# Patient Record
Sex: Male | Born: 1970 | Race: Black or African American | Hispanic: No | Smoking: Never smoker
Health system: Southern US, Community
[De-identification: ages and names within clinical notes are randomized; demographics above are authoritative.]

## PROBLEM LIST (undated history)

## (undated) DIAGNOSIS — Z9989 Dependence on other enabling machines and devices: Secondary | ICD-10-CM

## (undated) DIAGNOSIS — R079 Chest pain, unspecified: Secondary | ICD-10-CM

## (undated) DIAGNOSIS — G4733 Obstructive sleep apnea (adult) (pediatric): Secondary | ICD-10-CM

## (undated) DIAGNOSIS — R7303 Prediabetes: Secondary | ICD-10-CM

## (undated) DIAGNOSIS — K469 Unspecified abdominal hernia without obstruction or gangrene: Secondary | ICD-10-CM

## (undated) DIAGNOSIS — I10 Essential (primary) hypertension: Secondary | ICD-10-CM

## (undated) DIAGNOSIS — Z6841 Body Mass Index (BMI) 40.0 and over, adult: Secondary | ICD-10-CM

## (undated) HISTORY — PX: OTHER SURGICAL HISTORY: SHX169

## (undated) HISTORY — DX: Prediabetes: R73.03

## (undated) HISTORY — PX: KNEE SURGERY: SHX244

## (undated) HISTORY — DX: Chest pain, unspecified: R07.9

## (undated) HISTORY — DX: Obstructive sleep apnea (adult) (pediatric): G47.33

## (undated) HISTORY — PX: EYE SURGERY: SHX253

---

## 1998-08-09 ENCOUNTER — Emergency Department (HOSPITAL_COMMUNITY): Admission: EM | Admit: 1998-08-09 | Discharge: 1998-08-09 | Payer: Self-pay | Admitting: Emergency Medicine

## 1998-08-09 ENCOUNTER — Encounter: Payer: Self-pay | Admitting: Emergency Medicine

## 2002-05-30 DIAGNOSIS — G4733 Obstructive sleep apnea (adult) (pediatric): Secondary | ICD-10-CM

## 2002-05-30 HISTORY — DX: Obstructive sleep apnea (adult) (pediatric): G47.33

## 2002-09-16 ENCOUNTER — Emergency Department (HOSPITAL_COMMUNITY): Admission: EM | Admit: 2002-09-16 | Discharge: 2002-09-16 | Payer: Self-pay | Admitting: Emergency Medicine

## 2002-10-18 ENCOUNTER — Emergency Department (HOSPITAL_COMMUNITY): Admission: EM | Admit: 2002-10-18 | Discharge: 2002-10-19 | Payer: Self-pay | Admitting: Emergency Medicine

## 2003-08-05 ENCOUNTER — Emergency Department (HOSPITAL_COMMUNITY): Admission: EM | Admit: 2003-08-05 | Discharge: 2003-08-05 | Payer: Self-pay | Admitting: Family Medicine

## 2004-04-30 ENCOUNTER — Emergency Department (HOSPITAL_COMMUNITY): Admission: EM | Admit: 2004-04-30 | Discharge: 2004-04-30 | Payer: Self-pay | Admitting: Emergency Medicine

## 2006-10-13 ENCOUNTER — Emergency Department (HOSPITAL_COMMUNITY): Admission: EM | Admit: 2006-10-13 | Discharge: 2006-10-13 | Payer: Self-pay | Admitting: Emergency Medicine

## 2007-06-14 ENCOUNTER — Emergency Department (HOSPITAL_COMMUNITY): Admission: EM | Admit: 2007-06-14 | Discharge: 2007-06-14 | Payer: Self-pay | Admitting: Family Medicine

## 2008-03-22 ENCOUNTER — Emergency Department (HOSPITAL_COMMUNITY): Admission: EM | Admit: 2008-03-22 | Discharge: 2008-03-22 | Payer: Self-pay | Admitting: Emergency Medicine

## 2008-03-26 ENCOUNTER — Emergency Department (HOSPITAL_COMMUNITY): Admission: EM | Admit: 2008-03-26 | Discharge: 2008-03-26 | Payer: Self-pay | Admitting: Family Medicine

## 2008-10-13 ENCOUNTER — Ambulatory Visit (HOSPITAL_BASED_OUTPATIENT_CLINIC_OR_DEPARTMENT_OTHER): Admission: RE | Admit: 2008-10-13 | Discharge: 2008-10-13 | Payer: Self-pay | Admitting: Otolaryngology

## 2008-10-18 ENCOUNTER — Ambulatory Visit: Payer: Self-pay | Admitting: Internal Medicine

## 2009-01-26 ENCOUNTER — Emergency Department (HOSPITAL_COMMUNITY): Admission: EM | Admit: 2009-01-26 | Discharge: 2009-01-26 | Payer: Self-pay | Admitting: Family Medicine

## 2010-10-12 NOTE — Procedures (Signed)
NAME:  MCDONALD, REILING NO.:  [id number]   MEDICAL RECORD NO.:  [id number]          PATIENT TYPE:  OUT   LOCATION:  SLEEP CENTER                 FACILITY:  [facility]   PHYSICIAN:  Clinton D. Maple Hudson, MD, FCCP, FACPDATE OF BIRTH:  20-May-1972   DATE OF STUDY:  10/13/2008                            NOCTURNAL POLYSOMNOGRAM   REFERRING PHYSICIAN:   REFERRING PHYSICIAN:  Jefry H. Pollyann Kennedy, MD   INDICATION FOR STUDY:  Hypersomnia with sleep apnea.   EPWORTH SLEEPINESS SCORE:  Epworth sleepiness score 11/24, BMI 33.9,  weight 250 pounds, height 72 inches and neck 17.5 inches.   MEDICATIONS:  No home medication was listed.   SLEEP ARCHITECTURE:  Total sleep time 383 minutes with sleep efficiency  81%.  Stage I was 7%, stage II 74.4%, stage III absent, REM 18.5% of  total sleep time.  Sleep latency 80 minutes, REM latency 121 minutes,  awake after sleep onset 11 minutes, arousal index 35.6.  No bedtime  medication was taken.   RESPIRATORY DATA:  Diagnostic NPSG protocol ordered.  Apnea/hypopnea  index (AHI) 29.6 per hour.  A total of 189 events were scored including  144 obstructive apneas, two central apneas, two mixed apneas and 41  hypopneas.  Events were not positional.  REM AHI 59.2 per hour.  An  additional 66 respiratory effort related arousals was counted for an  overall respiratory disturbance index (RDI) of 39.9 per hour.  This was  ordered as a diagnostic NPSG protocol and CPAP titration was not done.   OXYGEN DATA:  Moderately loud snoring with oxygen desaturation to a  nadir of 68%.  Mean oxygen saturation through the study was 96.6% on  room air.  A total of 8.3 minutes was spent with oxygen saturation less  than 88% on room air.   CARDIAC DATA:  Normal sinus rhythm.   MOVEMENT-PARASOMNIA:  No significant movement disturbance.  No bathroom  trips.   IMPRESSIONS-RECOMMENDATIONS:  1. Moderate obstructive sleep apnea/hypopnea syndrome, AHI 29.6, RDI      39.9  per hour.  Events were not positional.  Moderately loud      snoring with oxygen desaturation to a nadir of 68%.  2. This was a diagnostic NPSG protocol as ordered.  Consider return      for CPAP titration or evaluate for alternative management as      clinically indicated.      Clinton D. Maple Hudson, MD, [facility], FACP  Diplomate, Biomedical engineer of Sleep Medicine  Electronically Signed     CDY/MEDQ  D:  10/18/2008 10:20:41  T:  10/19/2008 05:09:57  Job:  [id number]

## 2011-11-07 ENCOUNTER — Emergency Department (HOSPITAL_COMMUNITY)
Admission: EM | Admit: 2011-11-07 | Discharge: 2011-11-07 | Disposition: A | Discharge status: Home / Self Care | Payer: Self-pay | Attending: Emergency Medicine | Admitting: Emergency Medicine

## 2011-11-07 ENCOUNTER — Encounter (HOSPITAL_COMMUNITY): Payer: Self-pay | Admitting: *Deleted

## 2011-11-07 DIAGNOSIS — R109 Unspecified abdominal pain: Secondary | ICD-10-CM | POA: Insufficient documentation

## 2011-11-07 DIAGNOSIS — R112 Nausea with vomiting, unspecified: Secondary | ICD-10-CM

## 2011-11-07 DIAGNOSIS — R197 Diarrhea, unspecified: Secondary | ICD-10-CM

## 2011-11-07 LAB — URINALYSIS, ROUTINE W REFLEX MICROSCOPIC
Bilirubin Urine: NEGATIVE
Glucose, UA: NEGATIVE mg/dL
Hgb urine dipstick: NEGATIVE
Ketones, ur: NEGATIVE mg/dL
Leukocytes, UA: NEGATIVE
Nitrite: NEGATIVE
Protein, ur: NEGATIVE mg/dL
Specific Gravity, Urine: 1.03 (ref 1.005–1.030)
Urobilinogen, UA: 0.2 mg/dL (ref 0.0–1.0)
pH: 5.5 (ref 5.0–8.0)

## 2011-11-07 MED ORDER — LOPERAMIDE HCL 2 MG PO CAPS: 2.0000 mg | ORAL_CAPSULE | Freq: Four times a day (QID) | ORAL | Status: AC | PRN

## 2011-11-07 MED ORDER — LOPERAMIDE HCL 2 MG PO CAPS
2.0000 mg | ORAL_CAPSULE | Freq: Once | ORAL | Status: AC
  Administered 2011-11-07: 2 mg via ORAL
  Filled 2011-11-07: qty 1

## 2011-11-07 MED ORDER — ONDANSETRON HCL 4 MG PO TABS: 4.0000 mg | ORAL_TABLET | Freq: Four times a day (QID) | ORAL | Status: AC | PRN

## 2011-11-07 MED ORDER — ONDANSETRON 4 MG PO TBDP
4.0000 mg | ORAL_TABLET | Freq: Once | ORAL | Status: AC
  Administered 2011-11-07: 4 mg via ORAL
  Filled 2011-11-07: qty 1

## 2011-11-07 NOTE — Discharge Instructions (Signed)
RESOURCE GUIDE  Chronic Pain Problems: Contact [facility]  [phone] Patients need to be referred by their primary care doctor.  Insufficient Money for Medicine: Contact United Way:  call "211" or [facility] [phone].  No Primary Care Doctor: - Call [facility]  [phone] - can help you locate a primary care doctor that  accepts your insurance, provides certain services, etc. - Physician Referral Service- 1-800-533-3463  Agencies that provide inexpensive medical care: - [facility]  [phone] - [facility]  [phone] - [facility]  [phone] - [facility] Clinic  [phone] - Planned Parenthood  [phone] - [facility]  [phone]  Medicaid-accepting [place] Providers: - [facility]- [address]  [phone], Mon-Fri 9am-7pm, Sat 9am-1pm - [facility]- [address], [address]  [phone] - [facility]- [address], [address]  [phone] - [facility]- [address] [place] [address]  [phone] - Veita Bland- [address], [address], [phone]  Only accepts [place] Access Medicaid patients after they have their name  applied to their card  Self Pay (no insurance) in [place]: - Sickle Cell Patients: Dr Eric Dean, [facility]  [address], [phone] - [facility]- [address]  [phone]       -     [facility]- [address]       -     [facility]- see information above (Speak to Pam H if you do not have insurance)       -  [facility]- [address], [phone]       -  [facility] [place]- [address],  [phone]       -  [facility]- [address] [place] [address], [phone]       -  Dr Osei-Bonsu-  [address], [place], [phone]       -  [facility]- [address], [phone]       -  [facility]- [address] [place] [address], [phone], also [address], [phone]       -    [facility]- [address], [phone], 1st & 3rd Saturday   every month, 10am-1pm  1) Find a Doctor and Pay Out of Pocket Although you won't have to find out who is covered by your insurance plan, it is a good idea to ask around and get recommendations. You will then need to call the office and see if the doctor you have chosen will accept you as a new patient and what types of options they offer for patients who are self-pay. Some doctors offer discounts or will set up payment plans for their patients who do not have insurance, but you will need to ask so you aren't surprised when you get to your appointment.  2) Contact Your Local Health Department Not all health departments have doctors that can see patients for sick visits, but many do, so it is worth a call to see if yours does. If you don't know where your local health department is, you can check in your phone book. The CDC also has a tool to help you locate your state's health department, and many state websites also have   listings of all of their local health departments.  3) Find a Walk-in Clinic If your illness is not likely to be very severe or complicated, you may want to try a walk in clinic. These are popping up all over the country in pharmacies, drugstores, and shopping centers. They're usually staffed by nurse practitioners or physician assistants that have been trained to treat common illnesses and complaints. They're usually fairly quick and inexpensive. However, if you have serious medical issues or chronic medical problems, these are probably not your best option  STD Testing - [place] [facility] [place], [facility] Clinic, [address], [place], phone [phone] or 1-877-539-9860.  Monday - Friday, call for an appointment. - [place]  [facility] [place], [facility]D Clinic, [address], [place], phone [phone] or 1-877-539-9860.  Monday - Friday, call for an appointment.  Abuse/Neglect: - [place] Child Abuse Hotline (336) 641-3795 - [place] Child Abuse Hotline 800-378-5315 (After Hours)  Emergency Shelter:  [facility] (336) 271-5985  Maternity Homes: - [facility] (336) 275-9566 - [facility] (704) 372-4663  MRSA Hotline #:   832-7006  [place] Resources  [facility] [place]  [employer] [place] [facility] [address]                 [address]         [address]  [place]                                               [place]                              [place] Phone:  [phone]                                  Phone:  [phone]                   Phone:  [phone]  [place] [facility], [phone] - [place] Services - [facility]- 1-888-581-9988       -     [facility] in , [address],                                  336-349-4454, Insurance  [place] Child Abuse Hotline (336) 342-1394 or (336) 342-3537 (After Hours)   Behavioral Health Services  Substance Abuse Resources: - [facility]  336-882-2125 - [facility] 336-784-9470 - [facility] 336-285-9073 - [facility] 336-845-3988 - Residential & Outpatient Substance Abuse Program  800-659-3381  Psychological Services: - [facility]  [phone] - [facility]  [phone] - [place] [facility], [address], [place], ACCESS LINE: 1-800-853-5163 or 336-641-4981, Http://www.[facility].com/services/adult.htm  Dental Assistance  If unable to pay or uninsured, contact:  [facility] or [place] [facility] to become qualified for the adult dental  clinic.  Patients with Medicaid:  [facility] [address], [phone], [phone]  If unable   to pay, or uninsured, contact [facility] ([phone]) or [place] [facility] ([phone] in [place], [phone] in [place]) to become qualified for the adult dental clinic  Other Low-Cost Community Dental Services: - [facility]- [address], [place], [place], [address], [phone], Ext. [address], 2nd and 4th Thursday of the month at 6:30am.  10 clients each day by appointment, can sometimes see walk-in patients if someone does not show for an appointment. - [facility]- [address], [place], [place], [address], [phone] - [facility]- [address], [place], [place], [address], [phone] - [place] [facility]- [phone] - [facility]- [phone] - [facility]- [phone]    

## 2011-11-07 NOTE — ED Notes (Signed)
Pt states "starting having abdominal pain with vomiting & diarrhea since Saturday, think it's a virus"

## 2011-11-07 NOTE — ED Notes (Signed)
md at bedside  Pt alert and oriented x4. Respirations even and unlabored, bilateral symmetrical rise and fall of chest. Skin warm and dry. In no acute distress. Denies needs.   

## 2011-11-07 NOTE — ED Provider Notes (Signed)
History    41 year old male with abdominal pain, vomiting and diarrhea. Gradual onset of symptoms. On since Saturday. Persistent since. Abdominal pain is diffuse and crampy. No appreciable exacerbating. Several episodes of nonbilious, nonbloody emesis. No blood in the stool. No emesis. No sick contacts. No fevers or chills. Denies history of abdominal surgery. No urinary complaints. or relieving factors.  CSN: [id number]  Arrival date & time 11/07/11  1419   First MD Initiated Contact with Patient 11/07/11 1550      Chief Complaint  Patient presents with  . Abdominal Pain    (Consider location/radiation/quality/duration/timing/severity/associated sxs/prior treatment) HPI  History reviewed. No pertinent past medical history.  Past Surgical History  Procedure Date  . Knee surgery     left    No family history on file.  History  Substance Use Topics  . Smoking status: Never Smoker   . Smokeless tobacco: Not on file  . Alcohol Use: Yes     ocassionally      Review of Systems   Review of symptoms negative unless otherwise noted in HPI.   Allergies  Review of patient's allergies indicates no known allergies.  Home Medications   Current Outpatient Rx  Name Route Sig Dispense Refill  . NAPROXEN SODIUM 220 MG PO TABS Oral Take 220 mg by mouth 2 (two) times daily with a meal.    . LOPERAMIDE HCL 2 MG PO CAPS Oral Take 1 capsule (2 mg total) by mouth 4 (four) times daily as needed for diarrhea or loose stools. 12 capsule 0  . ONDANSETRON HCL 4 MG PO TABS Oral Take 1 tablet (4 mg total) by mouth every 6 (six) hours as needed for nausea. 12 tablet 0    BP 139/91  Pulse 87  Temp(Src) 98.8 F (37.1 C) (Oral)  Resp 17  Wt 267 lb 3.2 oz (121.201 kg)  SpO2 99%  Physical Exam  Nursing note and vitals reviewed. Constitutional: He appears well-developed and well-nourished. No distress.  HENT:  Head: Normocephalic and atraumatic.  Eyes: Conjunctivae are normal. Right eye  exhibits no discharge. Left eye exhibits no discharge.  Neck: Neck supple.  Cardiovascular: Normal rate, regular rhythm and normal heart sounds.  Exam reveals no gallop and no friction rub.   No murmur heard. Pulmonary/Chest: Effort normal and breath sounds normal. No respiratory distress.  Abdominal: Soft. He exhibits no distension and no mass. There is no tenderness.  Genitourinary:       No CVA tenderness  Musculoskeletal: He exhibits no edema and no tenderness.  Neurological: He is alert.  Skin: Skin is warm and dry.  Psychiatric: He has a normal mood and affect. His behavior is normal. Thought content normal.    ED Course  Procedures (including critical care time)   Labs Reviewed  URINALYSIS, ROUTINE W REFLEX MICROSCOPIC  LAB REPORT - SCANNED   No results found.   1. Nausea & vomiting   2. Diarrhea       MDM  41 year old male with nausea, vomiting and diarrhea. Suspect viral illness. Abdominal exam is benign. Patient is afebrile and HD stable. Do not feel that imaging or laboratory testing is needed at this time as I have a low suspicion for emergent etiology. Plan symptomatic treatment. Return precautions were discussed. Work note was provided. Patient works as a Financial risk analyst. Discussed with patient that he should not return to work until symptoms have resolved.        Raeford Razor, MD 11/10/11 [phone]

## 2011-11-07 NOTE — Progress Notes (Signed)
Pt seen by [facility] community liaison and states he has insurance coverage but left insurance card at home.  Refused [facility] information

## 2012-01-06 ENCOUNTER — Emergency Department (HOSPITAL_COMMUNITY)
Admission: EM | Admit: 2012-01-06 | Discharge: 2012-01-06 | Disposition: A | Discharge status: Home / Self Care | Payer: Self-pay | Attending: Emergency Medicine | Admitting: Emergency Medicine

## 2012-01-06 ENCOUNTER — Encounter (HOSPITAL_COMMUNITY): Payer: Self-pay | Admitting: *Deleted

## 2012-01-06 DIAGNOSIS — H109 Unspecified conjunctivitis: Secondary | ICD-10-CM | POA: Insufficient documentation

## 2012-01-06 MED ORDER — POLYMYXIN B-TRIMETHOPRIM 10000-0.1 UNIT/ML-% OP SOLN
1.0000 [drp] | OPHTHALMIC | Status: DC
  Administered 2012-01-06: 1 [drp] via OPHTHALMIC
  Filled 2012-01-06: qty 10

## 2012-01-06 MED ORDER — POLYMYXIN B-TRIMETHOPRIM 10000-0.1 UNIT/ML-% OP SOLN: 1.0000 [drp] | OPHTHALMIC | Status: AC

## 2012-01-06 NOTE — ED Notes (Signed)
Pt awaiting eye drops from pharmacy

## 2012-01-06 NOTE — ED Provider Notes (Signed)
History     CSN: [id number]  Arrival date & time 01/06/12  1434   First MD Initiated Contact with Patient 01/06/12 1514      Chief Complaint  Patient presents with  . Eye Pain    (Consider location/radiation/quality/duration/timing/severity/associated sxs/prior treatment) Patient is a 41 y.o. male presenting with conjunctivitis. The history is provided by the patient.  Conjunctivitis  The current episode started 3 to 5 days ago. The onset was gradual. The problem occurs continuously. The problem has been unchanged. The problem is mild. Nothing relieves the symptoms. Nothing aggravates the symptoms. Associated symptoms include eye itching and eye redness. Pertinent negatives include no fever, no decreased vision, no double vision, no photophobia, no headaches, no swollen glands, no URI, no eye discharge and no eye pain. The eye pain is mild. There is pain in the right eye. The eye pain is not associated with movement. The eyelid exhibits redness. There were no sick contacts.    History reviewed. No pertinent past medical history.  Past Surgical History  Procedure Date  . Knee surgery     left    History reviewed. No pertinent family history.  History  Substance Use Topics  . Smoking status: Never Smoker   . Smokeless tobacco: Not on file  . Alcohol Use: Yes     ocassionally      Review of Systems  Constitutional: Negative for fever.  Eyes: Positive for redness and itching. Negative for double vision, photophobia, pain and discharge.  Neurological: Negative for headaches.    Allergies  Review of patient's allergies indicates no known allergies.  Home Medications  No current outpatient prescriptions on file.  BP 135/89  Pulse 91  Temp 98.5 F (36.9 C) (Oral)  Resp 19  SpO2 98%  Physical Exam  Nursing note and vitals reviewed. Constitutional: He appears well-developed and well-nourished. No distress.  HENT:  Head: Normocephalic and atraumatic.  Right Ear:  External ear normal.  Left Ear: External ear normal.  Eyes: EOM and lids are normal. Pupils are equal, round, and reactive to light. Right eye exhibits chemosis. Right eye exhibits no discharge and no exudate. No foreign body present in the right eye. Left eye exhibits no discharge. Right conjunctiva is injected. Right conjunctiva has no hemorrhage. No scleral icterus.  Neck: Neck supple. No tracheal deviation present.  Cardiovascular: Normal rate.   Pulmonary/Chest: Effort normal. No stridor. No respiratory distress.  Musculoskeletal: He exhibits no edema.  Neurological: He is alert. Cranial nerve deficit: no gross deficits.  Skin: Skin is warm and dry. No rash noted.  Psychiatric: He has a normal mood and affect.    ED Course  Procedures (including critical care time)  Labs Reviewed - No data to display No results found.   1. Conjunctivitis of right eye       MDM  Patient without signs of visual disturbance or pain. The symptoms are primarily itching. This is consistent with an infectious conjunctivitis. Most likely viral but will prescribe antibiotics in case of possible bacterial cause.        Celene Kras, MD 01/06/12 [phone]

## 2012-01-06 NOTE — ED Notes (Signed)
Pt c/o right eye pain and redness x 4 days; pt denies obvious injury 

## 2012-01-07 ENCOUNTER — Encounter (HOSPITAL_COMMUNITY): Payer: Self-pay | Admitting: *Deleted

## 2012-01-07 ENCOUNTER — Emergency Department (HOSPITAL_COMMUNITY)
Admission: EM | Admit: 2012-01-07 | Discharge: 2012-01-07 | Disposition: A | Discharge status: Home / Self Care | Payer: Self-pay | Attending: Emergency Medicine | Admitting: Emergency Medicine

## 2012-01-07 DIAGNOSIS — H10029 Other mucopurulent conjunctivitis, unspecified eye: Secondary | ICD-10-CM | POA: Insufficient documentation

## 2012-01-07 DIAGNOSIS — H109 Unspecified conjunctivitis: Secondary | ICD-10-CM

## 2012-01-07 NOTE — ED Provider Notes (Signed)
History    This chart was scribed for Gerhard Munch, MD, MD by Smitty Pluck. The patient was seen in room TR02C and the patient's care was started at 2:32PM.   CSN: [id number]  Arrival date & time 01/07/12  1349   None     Chief Complaint  Patient presents with  . Eye Problem    (Consider location/radiation/quality/duration/timing/severity/associated sxs/prior treatment) Patient is a 41 y.o. male presenting with eye problem. The history is provided by the patient.  Eye Problem  Associated symptoms include discharge and eye redness. Pertinent negatives include no vomiting.   Christopher Schultz is a 41 y.o. male who presents to the Emergency Department complaining of moderate right eye irritation onset 4 days ago. Pt was in ED 1 day ago and diagnosed with pink eye. Pt reports that he has used eye drops without relief. Denies visual changes. Pt reports that eye irritation started on right and the left eye is starting to feel irritated.   History reviewed. No pertinent past medical history.  Past Surgical History  Procedure Date  . Knee surgery     left    No family history on file.  History  Substance Use Topics  . Smoking status: Never Smoker   . Smokeless tobacco: Not on file  . Alcohol Use: Yes     ocassionally      Review of Systems  Constitutional:       Per HPI, otherwise negative  HENT:       Per HPI, otherwise negative  Eyes: Positive for pain, discharge, redness and itching. Negative for visual disturbance.  Respiratory:       Per HPI, otherwise negative  Cardiovascular:       Per HPI, otherwise negative  Gastrointestinal: Negative for vomiting.  Genitourinary: Negative.   Musculoskeletal:       Per HPI, otherwise negative  Skin: Negative.   Neurological: Negative for syncope.    Allergies  Review of patient's allergies indicates no known allergies.  Home Medications   Current Outpatient Rx  Name Route Sig Dispense Refill  . POLYMYXIN  B-TRIMETHOPRIM 10000-0.1 UNIT/ML-% OP SOLN Right Eye Place 1 drop into the right eye every 4 (four) hours. 10 mL 0    BP 142/94  Pulse 84  Temp 98 F (36.7 C) (Oral)  Resp 16  SpO2 99%  Physical Exam  Nursing note and vitals reviewed. Constitutional: He is oriented to person, place, and time. He appears well-developed and well-nourished.  HENT:  Head: Normocephalic and atraumatic.  Eyes: Right eye exhibits discharge. Left eye exhibits no discharge. Right conjunctiva is injected.       Right eye has area of erythema     Pulmonary/Chest: Effort normal. No respiratory distress.  Neurological: He is alert and oriented to person, place, and time.  Skin: Skin is warm and dry.  Psychiatric: He has a normal mood and affect. His behavior is normal.    ED Course  Procedures (including critical care time)   COORDINATION OF CARE: 2:38PM EDP discusses pt ED treatment with pt     Labs Reviewed - No data to display No results found.   No diagnosis found.    MDM  The patient presents one day after initially being diagnosed with pink eye.  Notably, the patient denies any new acuity changes, uterus, chills, significant headache, nausea, vomiting, or other overt signs of systemic infection.  The patient's chief complaint seems to be irritation secondary to irritant exposure at work.  Given this, the patient was counseled on return precautions, discharged with a extended absence from work note, and ophthalmology followup in  Gerhard Munch, MD 01/07/12 1443

## 2012-01-07 NOTE — ED Notes (Signed)
Pt diagnosed with pink eye yesterday on right eye.  Same symptoms and no vision change

## 2012-04-22 ENCOUNTER — Encounter (HOSPITAL_COMMUNITY): Payer: Self-pay | Admitting: *Deleted

## 2012-04-22 ENCOUNTER — Emergency Department (HOSPITAL_COMMUNITY)
Admission: EM | Admit: 2012-04-22 | Discharge: 2012-04-22 | Disposition: A | Discharge status: Home / Self Care | Payer: Self-pay | Attending: Emergency Medicine | Admitting: Emergency Medicine

## 2012-04-22 ENCOUNTER — Emergency Department (HOSPITAL_COMMUNITY): Payer: Self-pay

## 2012-04-22 DIAGNOSIS — X58XXXA Exposure to other specified factors, initial encounter: Secondary | ICD-10-CM | POA: Insufficient documentation

## 2012-04-22 DIAGNOSIS — S93409A Sprain of unspecified ligament of unspecified ankle, initial encounter: Secondary | ICD-10-CM | POA: Insufficient documentation

## 2012-04-22 DIAGNOSIS — S93401A Sprain of unspecified ligament of right ankle, initial encounter: Secondary | ICD-10-CM

## 2012-04-22 DIAGNOSIS — Y9389 Activity, other specified: Secondary | ICD-10-CM | POA: Insufficient documentation

## 2012-04-22 DIAGNOSIS — Y9289 Other specified places as the place of occurrence of the external cause: Secondary | ICD-10-CM | POA: Insufficient documentation

## 2012-04-22 MED ORDER — HYDROCODONE-ACETAMINOPHEN 5-325 MG PO TABS: 1.0000 | ORAL_TABLET | ORAL | Status: DC | PRN

## 2012-04-22 MED ORDER — IBUPROFEN 800 MG PO TABS: 800.0000 mg | ORAL_TABLET | Freq: Three times a day (TID) | ORAL | Status: DC

## 2012-04-22 NOTE — ED Provider Notes (Signed)
Medical screening examination/treatment/procedure(s) were performed by non-physician practitioner and as supervising physician I was immediately available for consultation/collaboration.    Nelia Shi, MD 04/22/12 [phone]

## 2012-04-22 NOTE — ED Notes (Signed)
Reports stepping into a hole yesterday, having pain and swelling to right ankle.

## 2012-04-22 NOTE — Progress Notes (Signed)
Orthopedic Tech Progress Note Patient Details:  SAMIEL HEDQUIST [birth year] [id number] Ankle ASO applied to Right LE with instruction; crutches fitted for patient height and comfort. Patient stated he had used crutches in the past and felt safe to use. Ortho Devices Type of Ortho Device: Crutches;ASO Ortho Device/Splint Location: Right LE Ortho Device/Splint Interventions: Application   Asia R Thompson 04/22/2012, 11:59 AM

## 2012-04-22 NOTE — ED Provider Notes (Signed)
History     CSN: [id number]  Arrival date & time 04/22/12  6295   First MD Initiated Contact with Patient 04/22/12 1000      Chief Complaint  Patient presents with  . Ankle Pain    (Consider location/radiation/quality/duration/timing/severity/associated sxs/prior treatment) Patient is a 41 y.o. male presenting with ankle pain. The history is provided by the patient.  Ankle Pain  The incident occurred yesterday. The incident occurred in the yard. Associated symptoms comments: He complains of right ankle pain and swelling after stepping in a hole while working in the yard yesterday. No other injury.Marland Kitchen    History reviewed. No pertinent past medical history.  Past Surgical History  Procedure Date  . Knee surgery     left    History reviewed. No pertinent family history.  History  Substance Use Topics  . Smoking status: Never Smoker   . Smokeless tobacco: Not on file  . Alcohol Use: Yes     Comment: ocassionally      Review of Systems  Constitutional: Negative for fever and chills.  HENT: Negative.   Respiratory: Negative.   Cardiovascular: Negative.   Gastrointestinal: Negative.   Musculoskeletal: Negative.        See HPI  Skin: Negative.   Neurological: Negative.     Allergies  Review of patient's allergies indicates no known allergies.  Home Medications   Current Outpatient Rx  Name  Route  Sig  Dispense  Refill  . IBUPROFEN 200 MG PO TABS   Oral   Take 400 mg by mouth every 6 (six) hours as needed. For pain           BP 141/90  Pulse 92  Temp 98 F (36.7 C) (Oral)  Resp 18  SpO2 97%  Physical Exam  Constitutional: He is oriented to person, place, and time. He appears well-developed and well-nourished.  Neck: Normal range of motion.  Pulmonary/Chest: Effort normal.  Musculoskeletal: Normal range of motion.       Right ankle moderately swollen laterally without bony deformity. Joint stable. Tender bilaterally, most over lateral aspect.    Neurological: He is alert and oriented to person, place, and time.  Skin: Skin is warm and dry.  Psychiatric: He has a normal mood and affect.    ED Course  Procedures (including critical care time)  Labs Reviewed - No data to display Dg Ankle Complete Right  04/22/2012  *RADIOLOGY REPORT*  Clinical Data: 41 year old male with pain after injury.  RIGHT ANKLE - COMPLETE 3+ VIEW  Comparison: None.  Findings: Mortise joint effusion cannot be excluded.  Mortise joint alignment is preserved and the talar dome is intact.  However, there is a small avulsion fragment distal to the medial malleolus. Soft tissue swelling appears greater about the lateral malleolus, but no lateral fractures identified.  The calcaneus and posterior malleolus appear intact.  IMPRESSION: Soft tissue swelling and possible joint effusion.  Small acute avulsion fragment adjacent to the medial malleolus.  No other fracture or dislocation.   Original Report Authenticated By: Erskine Speed, M.D.      No diagnosis found. 1. Right ankle sprain   MDM  Uncomplicated ankle sprain.        Rodena Medin, PA-C 04/22/12 1111

## 2012-10-05 ENCOUNTER — Ambulatory Visit (HOSPITAL_COMMUNITY): Payer: Self-pay

## 2012-10-05 ENCOUNTER — Encounter (HOSPITAL_COMMUNITY): Payer: Self-pay

## 2012-10-05 ENCOUNTER — Emergency Department (HOSPITAL_COMMUNITY)
Admission: EM | Admit: 2012-10-05 | Discharge: 2012-10-05 | Disposition: A | Discharge status: Home / Self Care | Payer: Self-pay | Attending: Emergency Medicine | Admitting: Emergency Medicine

## 2012-10-05 DIAGNOSIS — K429 Umbilical hernia without obstruction or gangrene: Secondary | ICD-10-CM | POA: Insufficient documentation

## 2012-10-05 DIAGNOSIS — M549 Dorsalgia, unspecified: Secondary | ICD-10-CM | POA: Insufficient documentation

## 2012-10-05 DIAGNOSIS — R1905 Periumbilic swelling, mass or lump: Secondary | ICD-10-CM | POA: Insufficient documentation

## 2012-10-05 DIAGNOSIS — K420 Umbilical hernia with obstruction, without gangrene: Secondary | ICD-10-CM

## 2012-10-05 LAB — URINALYSIS, ROUTINE W REFLEX MICROSCOPIC
Bilirubin Urine: NEGATIVE
Glucose, UA: NEGATIVE mg/dL
Hgb urine dipstick: NEGATIVE
Ketones, ur: NEGATIVE mg/dL
Protein, ur: NEGATIVE mg/dL
Urobilinogen, UA: 0.2 mg/dL (ref 0.0–1.0)

## 2012-10-05 LAB — CBC WITH DIFFERENTIAL/PLATELET
Basophils Absolute: 0 10*3/uL (ref 0.0–0.1)
Eosinophils Absolute: 0.1 10*3/uL (ref 0.0–0.7)
Eosinophils Relative: 1 % (ref 0–5)
HCT: 45.7 % (ref 39.0–52.0)
Lymphocytes Relative: 28 % (ref 12–46)
MCH: 30 pg (ref 26.0–34.0)
MCV: 88.6 fL (ref 78.0–100.0)
Monocytes Absolute: 0.4 10*3/uL (ref 0.1–1.0)
RDW: 13.3 % (ref 11.5–15.5)
WBC: 4.4 10*3/uL (ref 4.0–10.5)

## 2012-10-05 LAB — BASIC METABOLIC PANEL
CO2: 28 mEq/L (ref 19–32)
Calcium: 9.7 mg/dL (ref 8.4–10.5)
Creatinine, Ser: 1.14 mg/dL (ref 0.50–1.35)
GFR calc non Af Amer: 79 mL/min — ABNORMAL LOW (ref >90)
Glucose, Bld: 95 mg/dL (ref 70–99)

## 2012-10-05 MED ORDER — IOHEXOL 300 MG/ML  SOLN
100.0000 mL | Freq: Once | INTRAMUSCULAR | Status: AC | PRN
  Administered 2012-10-05: 100 mL via INTRAVENOUS

## 2012-10-05 MED ORDER — HYDROCODONE-ACETAMINOPHEN 5-325 MG PO TABS: 1.0000 | ORAL_TABLET | Freq: Four times a day (QID) | ORAL | Status: DC | PRN

## 2012-10-05 MED ORDER — IOHEXOL 300 MG/ML  SOLN
50.0000 mL | Freq: Once | INTRAMUSCULAR | Status: AC | PRN
  Administered 2012-10-05: 50 mL via ORAL

## 2012-10-05 MED ORDER — SODIUM CHLORIDE 0.9 % IV SOLN
INTRAVENOUS | Status: DC
  Administered 2012-10-05: 14:00:00 via INTRAVENOUS

## 2012-10-05 NOTE — ED Provider Notes (Signed)
History     CSN: [id number]  Arrival date & time 10/05/12  1104   First MD Initiated Contact with Patient 10/05/12 1246      Chief Complaint  Patient presents with  . Abdominal Pain  . Back Pain    (Consider location/radiation/quality/duration/timing/severity/associated sxs/prior treatment) HPI Comments: Christopher Schultz is a 42 y.o. Male who is here for evaluation of umbilical swelling. The swelling has been present for several months and worsens when he stands. It is a sometimes dull and sometimes sharp pain. Yesterday. The pain was so bad, he could not continue working. He has never been evaluated for it. He denies fever, chills, nausea, vomiting, change in bowel or urinary habits. He has not tried a medication for the problem. There are no other modifying factors.  Patient is a 42 y.o. male presenting with abdominal pain and back pain. The history is provided by the patient.  Abdominal Pain Back Pain Associated symptoms: abdominal pain     History reviewed. No pertinent past medical history.  Past Surgical History  Procedure Laterality Date  . Knee surgery      left    No family history on file.  History  Substance Use Topics  . Smoking status: Never Smoker   . Smokeless tobacco: Not on file  . Alcohol Use: Yes     Comment: ocassionally      Review of Systems  Gastrointestinal: Positive for abdominal pain.  Musculoskeletal: Positive for back pain.  All other systems reviewed and are negative.    Allergies  Review of patient's allergies indicates no known allergies.  Home Medications   Current Outpatient Rx  Name  Route  Sig  Dispense  Refill  . ibuprofen (ADVIL,MOTRIN) 200 MG tablet   Oral   Take 400 mg by mouth every 6 (six) hours as needed. For pain         . HYDROcodone-acetaminophen (NORCO/VICODIN) 5-325 MG per tablet   Oral   Take 1 tablet by mouth every 6 (six) hours as needed for pain.   12 tablet   0   . ibuprofen (ADVIL,MOTRIN) 800 MG  tablet   Oral   Take 1 tablet (800 mg total) by mouth 3 (three) times daily.   21 tablet   0     BP 123/86  Pulse 72  Temp(Src) 98.5 F (36.9 C) (Oral)  Resp 16  Ht 6' (1.829 m)  Wt 277 lb 8 oz (125.873 kg)  BMI 37.63 kg/m2  SpO2 98%  Physical Exam  Nursing note and vitals reviewed. Constitutional: He is oriented to person, place, and time. He appears well-developed and well-nourished.  HENT:  Head: Normocephalic and atraumatic.  Right Ear: External ear normal.  Left Ear: External ear normal.  Eyes: Conjunctivae and EOM are normal. Pupils are equal, round, and reactive to light.  Neck: Normal range of motion and phonation normal. Neck supple.  Cardiovascular: Normal rate, regular rhythm, normal heart sounds and intact distal pulses.   Pulmonary/Chest: Effort normal and breath sounds normal. He exhibits no bony tenderness.  Abdominal: Soft. Normal appearance. There is no tenderness.  Peri-umbilical mass (3 cm), consistent with hernia. It is partially reducible. It gets somewhat better with standing. The skin over the tissue does not appear to be erythematous or discolored.  Musculoskeletal: Normal range of motion.  Neurological: He is alert and oriented to person, place, and time. He has normal strength. No cranial nerve deficit or sensory deficit. He exhibits normal muscle tone. Coordination  normal.  Skin: Skin is warm, dry and intact.  Psychiatric: He has a normal mood and affect. His behavior is normal. Judgment and thought content normal.    ED Course  Procedures (including critical care time)  Labs Reviewed  BASIC METABOLIC PANEL - Abnormal; Notable for the following:    GFR calc non Af Amer 79 (*)    All other components within normal limits  URINALYSIS, ROUTINE W REFLEX MICROSCOPIC  CBC WITH DIFFERENTIAL   No results found.   1. Irreducible umbilical hernia       MDM  Symptomatic umbilical hernia. CT ordered to rule out incarceration and intestinal  involvement.   Care to oncoming provided to evaluate after CT returns        Flint Melter, MD 10/08/12 [phone]

## 2012-10-05 NOTE — Progress Notes (Signed)
[facility] spoke with patient. Patient stated he was waiting for insurance to go through. Gave him [facility] in case he needed a follow-up before his insurance was available.

## 2012-10-05 NOTE — ED Provider Notes (Signed)
4:08 PM I discussed the CT results with the patient. We also discussed return precautions, and home care instructions, including techniques to reduce hernia. He was discharged in stable condition to follow up with surgery.  Gerhard Munch, MD 10/05/12 [phone]

## 2012-10-05 NOTE — ED Notes (Signed)
Pt presents with abdominal pain that started about a week ago. Pt also says that he feels a "knot" around his navel that has been there for a few years. No n/v/d. Also complaining of lower back pain that started three days ago. No injury.

## 2013-04-10 ENCOUNTER — Emergency Department (HOSPITAL_COMMUNITY): Payer: 59

## 2013-04-10 ENCOUNTER — Encounter (HOSPITAL_COMMUNITY): Payer: Self-pay | Admitting: Emergency Medicine

## 2013-04-10 ENCOUNTER — Emergency Department (HOSPITAL_COMMUNITY)
Admission: EM | Admit: 2013-04-10 | Discharge: 2013-04-10 | Disposition: A | Discharge status: Home / Self Care | Payer: 59 | Attending: Emergency Medicine | Admitting: Emergency Medicine

## 2013-04-10 DIAGNOSIS — K429 Umbilical hernia without obstruction or gangrene: Secondary | ICD-10-CM

## 2013-04-10 DIAGNOSIS — R109 Unspecified abdominal pain: Secondary | ICD-10-CM | POA: Insufficient documentation

## 2013-04-10 DIAGNOSIS — E663 Overweight: Secondary | ICD-10-CM | POA: Insufficient documentation

## 2013-04-10 LAB — COMPREHENSIVE METABOLIC PANEL
ALT: 36 U/L (ref 0–53)
AST: 23 U/L (ref 0–37)
Albumin: 3.9 g/dL (ref 3.5–5.2)
Calcium: 9.3 mg/dL (ref 8.4–10.5)
GFR calc Af Amer: >90 mL/min (ref >90)
Sodium: 140 mEq/L (ref 135–145)
Total Protein: 7.4 g/dL (ref 6.0–8.3)

## 2013-04-10 LAB — CBC WITH DIFFERENTIAL/PLATELET
Basophils Absolute: 0 10*3/uL (ref 0.0–0.1)
Basophils Relative: 1 % (ref 0–1)
Eosinophils Absolute: 0.1 10*3/uL (ref 0.0–0.7)
Eosinophils Relative: 2 % (ref 0–5)
MCH: 30.6 pg (ref 26.0–34.0)
MCV: 90.1 fL (ref 78.0–100.0)
Neutrophils Relative %: 63 % (ref 43–77)
Platelets: 275 10*3/uL (ref 150–400)
RDW: 13.2 % (ref 11.5–15.5)
WBC: 5 10*3/uL (ref 4.0–10.5)

## 2013-04-10 MED ORDER — HYDROCODONE-ACETAMINOPHEN 5-325 MG PO TABS: 1.0000 | ORAL_TABLET | Freq: Four times a day (QID) | ORAL | Status: DC | PRN

## 2013-04-10 MED ORDER — MORPHINE SULFATE 4 MG/ML IJ SOLN
4.0000 mg | Freq: Once | INTRAMUSCULAR | Status: AC
  Administered 2013-04-10: 4 mg via INTRAVENOUS
  Filled 2013-04-10: qty 1

## 2013-04-10 MED ORDER — IBUPROFEN 800 MG PO TABS: 800.0000 mg | ORAL_TABLET | Freq: Three times a day (TID) | ORAL | Status: DC

## 2013-04-10 MED ORDER — HYDROCODONE-ACETAMINOPHEN 5-325 MG PO TABS
1.0000 | ORAL_TABLET | Freq: Once | ORAL | Status: AC
  Administered 2013-04-10: 1 via ORAL
  Filled 2013-04-10: qty 1

## 2013-04-10 MED ORDER — ONDANSETRON HCL 4 MG/2ML IJ SOLN
4.0000 mg | Freq: Once | INTRAMUSCULAR | Status: AC
  Administered 2013-04-10: 4 mg via INTRAVENOUS
  Filled 2013-04-10: qty 2

## 2013-04-10 NOTE — ED Provider Notes (Signed)
CSN: [id number]     Arrival date & time 04/10/13  7829 History   First MD Initiated Contact with Patient 04/10/13 1014     Chief Complaint  Patient presents with  . Abdominal Pain   (Consider location/radiation/quality/duration/timing/severity/associated sxs/prior Treatment) HPI  THis is a 42 yo male with umbilical hernia who presents with abdominal pain.  Patient states he was diagnosed 5 months ago with an umbilical hernia.  Patient states that over the last 2-3 days he has had increasing "pressure" pain over the hernia.  It is reducible but "comes out frequently."  Denies n/v.  Last normal BM was this morning.  Rates pain at 7/10.  Denies fevers.  History reviewed. No pertinent past medical history. Past Surgical History  Procedure Laterality Date  . Knee surgery      left   History reviewed. No pertinent family history. History  Substance Use Topics  . Smoking status: Never Smoker   . Smokeless tobacco: Not on file  . Alcohol Use: Yes     Comment: ocassionally    Review of Systems  Constitutional: Negative.  Negative for fever.  Respiratory: Negative.  Negative for chest tightness and shortness of breath.   Cardiovascular: Negative.  Negative for chest pain.  Gastrointestinal: Positive for abdominal pain. Negative for vomiting, diarrhea and constipation.  Genitourinary: Negative.   All other systems reviewed and are negative.    Allergies  Review of patient's allergies indicates no known allergies.  Home Medications   Current Outpatient Rx  Name  Route  Sig  Dispense  Refill  . HYDROcodone-acetaminophen (NORCO/VICODIN) 5-325 MG per tablet   Oral   Take 1 tablet by mouth every 6 (six) hours as needed.   10 tablet   0   . ibuprofen (ADVIL,MOTRIN) 800 MG tablet   Oral   Take 1 tablet (800 mg total) by mouth 3 (three) times daily.   21 tablet   0    BP 143/94  Pulse 105  Temp(Src) 98.4 F (36.9 C) (Oral)  Resp 16  SpO2 100% Physical Exam  Nursing note  and vitals reviewed. Constitutional: He is oriented to person, place, and time. He appears well-developed and well-nourished. No distress.  overweight  HENT:  Head: Normocephalic and atraumatic.  Cardiovascular: Normal rate, regular rhythm and normal heart sounds.   No murmur heard. Pulmonary/Chest: Effort normal and breath sounds normal. No respiratory distress.  Abdominal: Soft. Bowel sounds are normal. He exhibits no distension. There is no tenderness. There is no rebound.  Reproducible umbilical hernia, no redness or ttp over the hernia  Musculoskeletal: He exhibits no edema.  Lymphadenopathy:    He has no cervical adenopathy.  Neurological: He is alert and oriented to person, place, and time.  Skin: Skin is warm and dry.  Psychiatric: He has a normal mood and affect.    ED Course  Procedures (including critical care time) Labs Review Labs Reviewed  COMPREHENSIVE METABOLIC PANEL - Abnormal; Notable for the following:    Total Bilirubin 0.1 (*)    GFR calc non Af Amer 89 (*)    All other components within normal limits  CBC WITH DIFFERENTIAL  LACTIC ACID, PLASMA   Imaging Review Dg Abd 1 View  04/10/2013   CLINICAL DATA:  Mid abdominal pain.  Umbilical hernia.  EXAM: ABDOMEN - 1 VIEW  COMPARISON:  CT scan dated 10/05/2012  FINDINGS: The bowel gas pattern is normal. No visible renal or ureteral stones. No significant osseous abnormality.  IMPRESSION: Benign  appearing abdomen.   Electronically Signed   By: Geanie Cooley M.D.   On: 04/10/2013 11:20    EKG Interpretation   None       MDM   1. Abdominal pain   2. Umbilical hernia    42 yo male with umbilical hernia who presents with abdominal pain.  Nontoxic on exam and vs reassuring.  Labwork including lactate wnl.  KUB without air fluid levels and patient not clinically obstructed.  Patient able to tolerate PO.  WIll given referral to general surgery for evaluation for hernia repair.  After history, exam, and medical  workup I feel the patient has been appropriately medically screened and is safe for discharge home. Pertinent diagnoses were discussed with the patient. Patient was given return precautions.   Shon Baton, MD 04/10/13 [phone]

## 2013-04-10 NOTE — ED Notes (Signed)
Pt c/o increasing pain from abd hernia; pt sts still able to reduce easily per pt but having increased pressure and pain

## 2013-04-22 ENCOUNTER — Ambulatory Visit (INDEPENDENT_AMBULATORY_CARE_PROVIDER_SITE_OTHER): Payer: Self-pay | Admitting: Surgery

## 2013-04-24 ENCOUNTER — Encounter (INDEPENDENT_AMBULATORY_CARE_PROVIDER_SITE_OTHER): Payer: Self-pay | Admitting: Surgery

## 2013-10-03 ENCOUNTER — Emergency Department (HOSPITAL_COMMUNITY): Payer: 59

## 2013-10-03 ENCOUNTER — Emergency Department (HOSPITAL_COMMUNITY)
Admission: EM | Admit: 2013-10-03 | Discharge: 2013-10-03 | Disposition: A | Discharge status: Home / Self Care | Payer: 59 | Attending: Emergency Medicine | Admitting: Emergency Medicine

## 2013-10-03 ENCOUNTER — Encounter (HOSPITAL_COMMUNITY): Payer: Self-pay | Admitting: Emergency Medicine

## 2013-10-03 DIAGNOSIS — K429 Umbilical hernia without obstruction or gangrene: Secondary | ICD-10-CM

## 2013-10-03 HISTORY — DX: Unspecified abdominal hernia without obstruction or gangrene: K46.9

## 2013-10-03 LAB — CBC WITH DIFFERENTIAL/PLATELET
BASOS ABS: 0 10*3/uL (ref 0.0–0.1)
BASOS PCT: 0 % (ref 0–1)
EOS PCT: 1 % (ref 0–5)
Eosinophils Absolute: 0.1 10*3/uL (ref 0.0–0.7)
HCT: 47.2 % (ref 39.0–52.0)
Hemoglobin: 15.5 g/dL (ref 13.0–17.0)
LYMPHS PCT: 30 % (ref 12–46)
Lymphs Abs: 1.7 10*3/uL (ref 0.7–4.0)
MCH: 29.4 pg (ref 26.0–34.0)
MCHC: 32.8 g/dL (ref 30.0–36.0)
MCV: 89.4 fL (ref 78.0–100.0)
MONO ABS: 0.5 10*3/uL (ref 0.1–1.0)
Monocytes Relative: 9 % (ref 3–12)
NEUTROS ABS: 3.4 10*3/uL (ref 1.7–7.7)
Neutrophils Relative %: 60 % (ref 43–77)
PLATELETS: 272 10*3/uL (ref 150–400)
RBC: 5.28 MIL/uL (ref 4.22–5.81)
RDW: 13.4 % (ref 11.5–15.5)
WBC: 5.6 10*3/uL (ref 4.0–10.5)

## 2013-10-03 LAB — COMPREHENSIVE METABOLIC PANEL
ALBUMIN: 4.1 g/dL (ref 3.5–5.2)
ALT: 34 U/L (ref 0–53)
AST: 21 U/L (ref 0–37)
Alkaline Phosphatase: 68 U/L (ref 39–117)
BUN: 11 mg/dL (ref 6–23)
CALCIUM: 9.4 mg/dL (ref 8.4–10.5)
CHLORIDE: 103 meq/L (ref 96–112)
CO2: 24 meq/L (ref 19–32)
CREATININE: 1.13 mg/dL (ref 0.50–1.35)
GFR calc Af Amer: >90 mL/min (ref >90)
GFR, EST NON AFRICAN AMERICAN: 79 mL/min — AB (ref >90)
Glucose, Bld: 86 mg/dL (ref 70–99)
Potassium: 4.1 mEq/L (ref 3.7–5.3)
SODIUM: 140 meq/L (ref 137–147)
Total Bilirubin: 0.3 mg/dL (ref 0.3–1.2)
Total Protein: 7.6 g/dL (ref 6.0–8.3)

## 2013-10-03 MED ORDER — HYDROCODONE-ACETAMINOPHEN 5-325 MG PO TABS: 1.0000 | ORAL_TABLET | ORAL | Status: DC | PRN

## 2013-10-03 NOTE — ED Provider Notes (Signed)
CSN: [id number]     Arrival date & time 10/03/13  1414 History   First MD Initiated Contact with Patient 10/03/13 1813     Chief Complaint  Patient presents with  . Hernia     (Consider location/radiation/quality/duration/timing/severity/associated sxs/prior Treatment) HPI Christopher Schultz is a 43 y.o. male who presents emergency department complaining of abdominal pain. Patient states that his abdominal pain is deep to his umbilical hernia. He states he has been having issues with hernia for several months. He states hernia will pop out especially when he's at work lifting heavy objects, and time he has difficulty pushing it back in. He states that he is able to push it back in each time however. States is becoming more frequent. He denies any pain at this time. He denies any nausea or vomiting. Last bowel movement was today and was normal. He denies any fever or chills. He states he is here just for a checkup and to get a work note. He states he has an appointment with a general surgeon in 2 weeks. States is the quickest he could get. Requesting pain medications at home the  Past Medical History  Diagnosis Date  . Hernia, abdominal    Past Surgical History  Procedure Laterality Date  . Knee surgery      left   No family history on file. History  Substance Use Topics  . Smoking status: Never Smoker   . Smokeless tobacco: Not on file  . Alcohol Use: Yes     Comment: ocassionally    Review of Systems  Constitutional: Negative for fever and chills.  Respiratory: Negative for cough, chest tightness and shortness of breath.   Cardiovascular: Negative for chest pain, palpitations and leg swelling.  Gastrointestinal: Positive for abdominal pain. Negative for nausea, vomiting, diarrhea and abdominal distention.  Genitourinary: Negative for dysuria, urgency, frequency and hematuria.  Musculoskeletal: Negative for arthralgias, myalgias, neck pain and neck stiffness.  Skin: Negative for  rash.  Allergic/Immunologic: Negative for immunocompromised state.  Neurological: Negative for dizziness, weakness, light-headedness, numbness and headaches.  All other systems reviewed and are negative.     Allergies  Review of patient's allergies indicates no known allergies.  Home Medications   Prior to Admission medications   Not on File   BP 136/94  Pulse 94  Temp(Src) 98.4 F (36.9 C) (Oral)  Resp 20  SpO2 97% Physical Exam  Nursing note and vitals reviewed. Constitutional: He appears well-developed and well-nourished. No distress.  HENT:  Head: Normocephalic and atraumatic.  Eyes: Conjunctivae are normal.  Neck: Neck supple.  Cardiovascular: Normal rate, regular rhythm and normal heart sounds.   Pulmonary/Chest: Effort normal. No respiratory distress. He has no wheezes. He has no rales.  Abdominal: Soft. Bowel sounds are normal. He exhibits no distension. There is no tenderness. There is no rebound and no guarding.  Abdomen is nontender, soft. Patient does have umbilical hernia however presented a soft, reducible, nontender. No erythema or swelling over the abdomen.  Musculoskeletal: He exhibits no edema.  Neurological: He is alert.  Skin: Skin is warm and dry.    ED Course  Procedures (including critical care time) Labs Review Labs Reviewed  COMPREHENSIVE METABOLIC PANEL - Abnormal; Notable for the following:    GFR calc non Af Amer 79 (*)    All other components within normal limits  CBC WITH DIFFERENTIAL    Imaging Review Dg Abd 2 Views  10/03/2013   CLINICAL DATA:  Umbilical hernia.  EXAM:  ABDOMEN - 2 VIEW  COMPARISON:  DG ABD 1 VIEW dated 04/10/2013  FINDINGS: The bowel gas pattern is normal. There is no evidence of free air. No radio-opaque calculi or other significant radiographic abnormality is seen. Scrotal pearls and phleboliths in the pelvis.  IMPRESSION: Negative. Please note, umbilical hernia may be difficult to appreciate on frontal radiographs.    Electronically Signed   By: Awilda Metroourtnay  Bloomer   On: 10/03/2013 19:06     EKG Interpretation None      MDM   Final diagnoses:  Umbilical hernia   Patient with history of umbilical hernia. Currently it is nonincarcerated. It is soft. It is not strangulated. There is no tenderness over entire abdomen. Two-view abdomen and labs are normal. Home with pain medications and followup with surgery.  Filed Vitals:   10/03/13 1445 10/03/13 1829  BP: 136/94 133/92  Pulse: 94 79  Temp: 98.4 F (36.9 C)   TempSrc: Oral   Resp: 20 20  SpO2: 97% 100%       Myriam Jacobsonatyana A Veronnica Hennings, PA-C 10/04/13 0037

## 2013-10-03 NOTE — ED Notes (Signed)
Pt states that he has umbilical hernia that can normally be pushed with but hasnt been able to bc of the pain being so bad.

## 2013-10-03 NOTE — ED Notes (Signed)
Initial contact-A&Ox4. C/o umbilical hernia pain with exertion. No c/o pain at rest. In to see Willey BladeEric Dean on 5/19 for evaluation. States "I usually keep pushing it back in and now it's too painful when I push it in-it radiates to my legs and it's a sharp pain." Denies fevers, chills, N, V, D. Last BM was this morning. PA at bedside to evaluate.

## 2013-10-03 NOTE — Discharge Instructions (Signed)
Take pain medications only as needed for severe pain. If unable to reduce hernia return to emergency department. Make sure to take a stool softener if taking pain medications. Followup with general surgery as soon as you're able   Hernia A hernia occurs when an internal organ pushes out through a weak spot in the abdominal wall. Hernias most commonly occur in the groin and around the navel. Hernias often can be pushed back into place (reduced). Most hernias tend to get worse over time. Some abdominal hernias can get stuck in the opening (irreducible or incarcerated hernia) and cannot be reduced. An irreducible abdominal hernia which is tightly squeezed into the opening is at risk for impaired blood supply (strangulated hernia). A strangulated hernia is a medical emergency. Because of the risk for an irreducible or strangulated hernia, surgery may be recommended to repair a hernia. CAUSES   Heavy lifting.  Prolonged coughing.  Straining to have a bowel movement.  A cut (incision) made during an abdominal surgery. HOME CARE INSTRUCTIONS   Bed rest is not required. You may continue your normal activities.  Avoid lifting more than 10 pounds (4.5 kg) or straining.  Cough gently. If you are a smoker it is best to stop. Even the best hernia repair can break down with the continual strain of coughing. Even if you do not have your hernia repaired, a cough will continue to aggravate the problem.  Do not wear anything tight over your hernia. Do not try to keep it in with an outside bandage or truss. These can damage abdominal contents if they are trapped within the hernia sac.  Eat a normal diet.  Avoid constipation. Straining over long periods of time will increase hernia size and encourage breakdown of repairs. If you cannot do this with diet alone, stool softeners may be used. SEEK IMMEDIATE MEDICAL CARE IF:   You have a fever.  You develop increasing abdominal pain.  You feel nauseous or  vomit.  Your hernia is stuck outside the abdomen, looks discolored, feels hard, or is tender.  You have any changes in your bowel habits or in the hernia that are unusual for you.  You have increased pain or swelling around the hernia.  You cannot push the hernia back in place by applying gentle pressure while lying down. MAKE SURE YOU:   Understand these instructions.  Will watch your condition.  Will get help right away if you are not doing well or get worse. Document Released: 05/16/2005 Document Revised: 08/08/2011 Document Reviewed: 01/03/2008 [facility] Patient Information 2014 [place], [place].

## 2013-10-04 NOTE — ED Provider Notes (Signed)
Medical screening examination/treatment/procedure(s) were performed by non-physician practitioner and as supervising physician I was immediately available for consultation/collaboration.  Jaceion Aday L Azalyn Sliwa, MD 10/04/13 0044 

## 2015-09-01 ENCOUNTER — Encounter (HOSPITAL_COMMUNITY): Payer: Self-pay

## 2015-09-01 ENCOUNTER — Emergency Department (HOSPITAL_COMMUNITY)
Admission: EM | Admit: 2015-09-01 | Discharge: 2015-09-01 | Disposition: A | Discharge status: Home / Self Care | Payer: Self-pay | Attending: Emergency Medicine | Admitting: Emergency Medicine

## 2015-09-01 ENCOUNTER — Emergency Department (HOSPITAL_COMMUNITY): Payer: Self-pay

## 2015-09-01 DIAGNOSIS — R1030 Lower abdominal pain, unspecified: Secondary | ICD-10-CM | POA: Insufficient documentation

## 2015-09-01 DIAGNOSIS — M545 Low back pain: Secondary | ICD-10-CM | POA: Insufficient documentation

## 2015-09-01 DIAGNOSIS — R3915 Urgency of urination: Secondary | ICD-10-CM | POA: Insufficient documentation

## 2015-09-01 DIAGNOSIS — Z8719 Personal history of other diseases of the digestive system: Secondary | ICD-10-CM | POA: Insufficient documentation

## 2015-09-01 LAB — URINALYSIS, ROUTINE W REFLEX MICROSCOPIC
BILIRUBIN URINE: NEGATIVE
GLUCOSE, UA: NEGATIVE mg/dL
HGB URINE DIPSTICK: NEGATIVE
Ketones, ur: NEGATIVE mg/dL
Leukocytes, UA: NEGATIVE
Nitrite: NEGATIVE
PH: 7 (ref 5.0–8.0)
Protein, ur: NEGATIVE mg/dL
SPECIFIC GRAVITY, URINE: 1.028 (ref 1.005–1.030)

## 2015-09-01 LAB — BASIC METABOLIC PANEL
Anion gap: 8 (ref 5–15)
BUN: 13 mg/dL (ref 6–20)
CALCIUM: 9.2 mg/dL (ref 8.9–10.3)
CO2: 25 mmol/L (ref 22–32)
CREATININE: 1.2 mg/dL (ref 0.61–1.24)
Chloride: 110 mmol/L (ref 101–111)
Glucose, Bld: 96 mg/dL (ref 65–99)
Potassium: 4.3 mmol/L (ref 3.5–5.1)
SODIUM: 143 mmol/L (ref 135–145)

## 2015-09-01 LAB — CBC WITH DIFFERENTIAL/PLATELET
Basophils Absolute: 0 K/uL (ref 0.0–0.1)
Basophils Relative: 0 %
Eosinophils Absolute: 0 K/uL (ref 0.0–0.7)
Eosinophils Relative: 1 %
HCT: 45.7 % (ref 39.0–52.0)
Hemoglobin: 15.1 g/dL (ref 13.0–17.0)
Lymphocytes Relative: 31 %
Lymphs Abs: 1.7 K/uL (ref 0.7–4.0)
MCH: 29.3 pg (ref 26.0–34.0)
MCHC: 33 g/dL (ref 30.0–36.0)
MCV: 88.6 fL (ref 78.0–100.0)
Monocytes Absolute: 0.5 K/uL (ref 0.1–1.0)
Monocytes Relative: 8 %
Neutro Abs: 3.4 K/uL (ref 1.7–7.7)
Neutrophils Relative %: 60 %
Platelets: 287 K/uL (ref 150–400)
RBC: 5.16 MIL/uL (ref 4.22–5.81)
RDW: 13.7 % (ref 11.5–15.5)
WBC: 5.6 K/uL (ref 4.0–10.5)

## 2015-09-01 MED ORDER — PHENAZOPYRIDINE HCL 200 MG PO TABS: 200.0000 mg | ORAL_TABLET | Freq: Three times a day (TID) | ORAL | Status: DC | PRN

## 2015-09-01 NOTE — ED Notes (Signed)
Patient transported to CT 

## 2015-09-01 NOTE — Discharge Instructions (Signed)
Read the information below.  Use the prescribed medication as directed.  Please discuss all new medications with your pharmacist.  You may return to the Emergency Department at any time for worsening condition or any new symptoms that concern you.   If you develop high fevers, worsening abdominal pain, uncontrolled vomiting, or are unable to tolerate fluids by mouth, return to the ER for a recheck.  ° °

## 2015-09-01 NOTE — ED Notes (Signed)
Pt with burning with urination.  Feels like he needs to go but he doesn't.  Been happening for a while.  Discomfort when trying to urinate.

## 2015-09-01 NOTE — ED Provider Notes (Signed)
CSN: [id number]     Arrival date & time 09/01/15  1128 History   First MD Initiated Contact with Patient 09/01/15 1213     Chief Complaint  Patient presents with  . Dysuria     (Consider location/radiation/quality/duration/timing/severity/associated sxs/prior Treatment) The history is provided by the patient.     Pt presents with 2 months of urinary urgency, feeling the urge to urinate without having to, suprapubic tenderness, right low back pain.  Pain intensity varies, does have some intense pains, always between 5-8/10 intensity.  Pt does have umbilical hernia that also causes him discomfort, especially with getting up and moving around, has always been able to push it back in.  Denies fever, vomiting, bowel changes, hematuria, abnormal penile discharge, testicular pain or swelling.    Past Medical History  Diagnosis Date  . Hernia, abdominal    Past Surgical History  Procedure Laterality Date  . Knee surgery      left   History reviewed. No pertinent family history. Social History  Substance Use Topics  . Smoking status: Never Smoker   . Smokeless tobacco: None  . Alcohol Use: Yes     Comment: ocassionally    Review of Systems  All other systems reviewed and are negative.     Allergies  Review of patient's allergies indicates no known allergies.  Home Medications   Prior to Admission medications   Not on File   BP 145/101 mmHg  Pulse 92  Temp(Src) 98.3 F (36.8 C) (Oral)  Resp 17  SpO2 98% Physical Exam  Constitutional: He appears well-developed and well-nourished. No distress.  HENT:  Head: Normocephalic and atraumatic.  Neck: Neck supple.  Cardiovascular: Normal rate and regular rhythm.   Pulmonary/Chest: Effort normal and breath sounds normal. No respiratory distress. He has no wheezes. He has no rales.  Abdominal: Soft. Bowel sounds are normal. He exhibits no distension. There is tenderness in the suprapubic area. There is no rebound and no guarding.     Neurological: He is alert. He exhibits normal muscle tone.  Skin: He is not diaphoretic.  Nursing note and vitals reviewed.   ED Course  Procedures (including critical care time) Labs Review Labs Reviewed  URINE CULTURE  URINALYSIS, ROUTINE W REFLEX MICROSCOPIC (NOT AT [facility])  BASIC METABOLIC PANEL  CBC WITH DIFFERENTIAL/PLATELET    Imaging Review Ct Abdomen Pelvis Wo Contrast  09/01/2015  CLINICAL DATA:  Dysuria. Right low back pain. Symptoms for 2 months EXAM: CT ABDOMEN AND PELVIS WITHOUT CONTRAST TECHNIQUE: Multidetector CT imaging of the abdomen and pelvis was performed following the standard protocol without IV contrast. COMPARISON:  10/05/2012 FINDINGS: No hydronephrosis.  No urinary calculus Diffuse hepatic steatosis with relative sparing next to the gallbladder Gallbladder, spleen, pancreas, right adrenal gland are within normal limits Stable left adrenal nodule measuring 2.0 cm. Stable periumbilical hernia containing soft tissue stranding. Normal appendix. Diverticulosis of the distal colon. No diverticulitis. Bladder and prostate are within normal limits No free-fluid. No vertebral compression deformity. Lumbosacral junction is transitional. 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 IMPRESSION: No evidence of urinary calculus or urinary obstruction. Electronically Signed   By: Jolaine Click M.D.   On: 09/01/2015 13:26   I have personally reviewed and evaluated these images and lab results as part of my medical decision-making.   EKG Interpretation None      MDM   Final diagnoses:  Urinary urgency    Afebrile nontoxic patient with urinary  symptoms, suprapubic discomfort.  UA negative, culture pending.  Labs WNL.  CT abd/pelvis does not demonstrate reason for patient's symptoms.  D/C home with urology follow up, pyridium.  Discussed result, findings, treatment, and follow up  with patient.  Pt given return precautions.  Pt  verbalizes understanding and agrees with plan.        Trixie Dredgemily Journiee Feldkamp, PA-C 09/01/15 1457  Benjiman CoreNathan Pickering, MD 09/01/15 [phone]

## 2015-09-02 LAB — URINE CULTURE: Culture: NO GROWTH

## 2017-03-03 ENCOUNTER — Emergency Department (HOSPITAL_COMMUNITY)
Admission: EM | Admit: 2017-03-03 | Discharge: 2017-03-03 | Disposition: A | Discharge status: Home / Self Care | Payer: Self-pay | Attending: Emergency Medicine | Admitting: Emergency Medicine

## 2017-03-03 ENCOUNTER — Emergency Department (HOSPITAL_COMMUNITY): Payer: Self-pay

## 2017-03-03 ENCOUNTER — Encounter (HOSPITAL_COMMUNITY): Payer: Self-pay | Admitting: Emergency Medicine

## 2017-03-03 DIAGNOSIS — K429 Umbilical hernia without obstruction or gangrene: Secondary | ICD-10-CM | POA: Insufficient documentation

## 2017-03-03 DIAGNOSIS — R112 Nausea with vomiting, unspecified: Secondary | ICD-10-CM | POA: Insufficient documentation

## 2017-03-03 DIAGNOSIS — R197 Diarrhea, unspecified: Secondary | ICD-10-CM | POA: Insufficient documentation

## 2017-03-03 LAB — URINALYSIS, ROUTINE W REFLEX MICROSCOPIC
Bilirubin Urine: NEGATIVE
Glucose, UA: NEGATIVE mg/dL
Hgb urine dipstick: NEGATIVE
Ketones, ur: NEGATIVE mg/dL
Nitrite: NEGATIVE
Protein, ur: NEGATIVE mg/dL
SPECIFIC GRAVITY, URINE: 1.026 (ref 1.005–1.030)
SQUAMOUS EPITHELIAL / LPF: NONE SEEN
pH: 5 (ref 5.0–8.0)

## 2017-03-03 LAB — CBC
HCT: 44.8 % (ref 39.0–52.0)
Hemoglobin: 15.1 g/dL (ref 13.0–17.0)
MCH: 30 pg (ref 26.0–34.0)
MCHC: 33.7 g/dL (ref 30.0–36.0)
MCV: 88.9 fL (ref 78.0–100.0)
PLATELETS: 339 10*3/uL (ref 150–400)
RBC: 5.04 MIL/uL (ref 4.22–5.81)
RDW: 13.6 % (ref 11.5–15.5)
WBC: 7.8 10*3/uL (ref 4.0–10.5)

## 2017-03-03 LAB — COMPREHENSIVE METABOLIC PANEL
ALK PHOS: 71 U/L (ref 38–126)
ALT: 22 U/L (ref 17–63)
AST: 16 U/L (ref 15–41)
Albumin: 4 g/dL (ref 3.5–5.0)
Anion gap: 13 (ref 5–15)
BUN: 13 mg/dL (ref 6–20)
CHLORIDE: 104 mmol/L (ref 101–111)
CO2: 24 mmol/L (ref 22–32)
CREATININE: 1.16 mg/dL (ref 0.61–1.24)
Calcium: 9.1 mg/dL (ref 8.9–10.3)
GFR calc Af Amer: >60 mL/min (ref >60)
Glucose, Bld: 94 mg/dL (ref 65–99)
Potassium: 3.5 mmol/L (ref 3.5–5.1)
SODIUM: 141 mmol/L (ref 135–145)
Total Bilirubin: 0.4 mg/dL (ref 0.3–1.2)
Total Protein: 7.4 g/dL (ref 6.5–8.1)

## 2017-03-03 LAB — LIPASE, BLOOD: LIPASE: 35 U/L (ref 11–51)

## 2017-03-03 MED ORDER — IOPAMIDOL (ISOVUE-300) INJECTION 61%
INTRAVENOUS | Status: AC
  Administered 2017-03-03: 100 mL
  Filled 2017-03-03: qty 100

## 2017-03-03 MED ORDER — SODIUM CHLORIDE 0.9 % IV BOLUS (SEPSIS)
1000.0000 mL | Freq: Once | INTRAVENOUS | Status: AC
Start: 2017-03-03 — End: 2017-03-03
  Administered 2017-03-03: 1000 mL via INTRAVENOUS

## 2017-03-03 MED ORDER — METOCLOPRAMIDE HCL 10 MG PO TABS: 10.0000 mg | ORAL_TABLET | Freq: Four times a day (QID) | ORAL | 0 refills | Status: DC | PRN

## 2017-03-03 NOTE — ED Notes (Signed)
Patient transported to CT 

## 2017-03-03 NOTE — ED Triage Notes (Signed)
Pt reports he has been unable to reduce his umbilical hernia for the past 2 weeks. Accompanied by abd cramping around the hernia and small amount of n/v/d.

## 2017-03-03 NOTE — Consult Note (Signed)
Surgical Consultation Requesting provider: Dr. Ethelda Chick  CC: nausea, diarrhea  HPI: This is a very nice 46 year old gentleman who works as a Biomedical scientist who presents to the ER with a long-standing history of generally not feeling well. He says that it has actually been years that he has had intermittent nausea and diarrhea. He isn't known local hernia which is also been present for years. In the last several months he's lost about 24 pounds. He experiences intermittent nausea, nonbilious emesis, and diarrhea. He denies melena or hematochezia, denies hematemesis. He does have mild abdominal pain on occasion. He states that his symptoms are not instigated by oral intake, but sometimes they are aggravated by eating. He denies any fevers to his knowledge area he has not had any prior abdominal surgery. He knows that his hernia has increased in size. Initially when he came to the ER he said he was not able to reduce it however the examining physician was able to reduce it, and the patient states that he also reduced himself after his CT scan. His last colonoscopy was about a year ago, he gets them every 3 years due to polyps, he stated he initially started getting them because of some blood in the stool.  No Known Allergies  Past Medical History:  Diagnosis Date  . Hernia, abdominal     Past Surgical History:  Procedure Laterality Date  . KNEE SURGERY     left    History reviewed. No pertinent family history.  Social History   Social History  . Marital status: Single    Spouse name: N/A  . Number of children: N/A  . Years of education: N/A   Social History Main Topics  . Smoking status: Never Smoker  . Smokeless tobacco: None  . Alcohol use Yes     Comment: ocassionally  . Drug use: No     Comment: x 1 month  . Sexual activity: Not Asked   Other Topics Concern  . None   Social History Narrative  . None    No current facility-administered medications on file prior to  encounter.    Current Outpatient Prescriptions on File Prior to Encounter  Medication Sig Dispense Refill  . phenazopyridine (PYRIDIUM) 200 MG tablet Take 1 tablet (200 mg total) by mouth 3 (three) times daily as needed for pain (bladder spasm). (Patient not taking: Reported on 03/03/2017) 6 tablet 0    Review of Systems: a complete, 10pt review of systems was completed with pertinent positives and negatives as documented in the HPI  Physical Exam: Vitals:   03/03/17 1835 03/03/17 1855  BP: (!) 152/109 (!) 158/110  Pulse: 83 84  Resp: 18   Temp: 98.6 F (37 C)   SpO2: 98%    Gen: A&Ox3, no distress Head: normocephalic, atraumatic, EOMI, anicteric.  Neck: supple without mass or thyromegaly Chest: unlabored respirations, symmetrical air entry   Cardiovascular: RRR with palpable distal pulses, no pedal edema Abdomen: obese, soft, nondistended, nontender except with deep pressure on the reducible umbilical hernia. The hernia essentially reduces when he is supine. No mass or organomegaly.  Extremities: warm, without edema, no deformities  Neuro: grossly intact, normal gait Psych: appropriate mood and affect, normal insight Skin: warm and dry   CBC Latest Ref Rng & Units 03/03/2017 09/01/2015 10/03/2013  WBC 4.0 - 10.5 K/uL 7.8 5.6 5.6  Hemoglobin 13.0 - 17.0 g/dL 16.1 09.6 04.5  Hematocrit 39.0 - 52.0 % 44.8 45.7 47.2  Platelets 150 - 400 K/uL  339 287 272    CMP Latest Ref Rng & Units 03/03/2017 09/01/2015 10/03/2013  Glucose 65 - 99 mg/dL 94 96 86  BUN 6 - 20 mg/dL Creatinine 0.61 - 1.24 mg/dL 9.14 7.82 9.56  Sodium 135 - 145 mmol/L 141 143 140  Potassium 3.5 - 5.1 mmol/L 3.5 4.3 4.1  Chloride 101 - 111 mmol/L 104 110 103  CO2 22 - 32 mmol/L Calcium 8.9 - 10.3 mg/dL 9.1 9.2 9.4  Total Protein 6.5 - 8.1 g/dL 7.4 - 7.6  Total Bilirubin 0.3 - 1.2 mg/dL 0.4 - 0.3  Alkaline Phos 38 - 126 U/L 71 - 68  AST 15 - 41 U/L 16 - 21  ALT 17 - 63 U/L 22 - 34    No results  found for: INR, PROTIME  Imaging: Ct Abdomen Pelvis W Contrast  Result Date: 03/03/2017 CLINICAL DATA:  Unable to reduce umbilical hernia, pain with nausea vomiting and diarrhea EXAM: CT ABDOMEN AND PELVIS WITH CONTRAST TECHNIQUE: Multidetector CT imaging of the abdomen and pelvis was performed using the standard protocol following bolus administration of intravenous contrast. CONTRAST:  ISOVUE-300 IOPAMIDOL (ISOVUE-300) INJECTION 61% COMPARISON:  09/01/2015 FINDINGS: Lower chest: No acute consolidation or pleural effusion. Normal heart size. Hepatobiliary: 12 mm vague hypodense lesion near the intra hepatic IVC. Additional vague hypodensity within the right hepatic lobe Negative for calcified gallbladder stones are biliary dilatation. Pancreas: Unremarkable. No pancreatic ductal dilatation or surrounding inflammatory changes. Spleen: Normal in size without focal abnormality. Adrenals/Urinary Tract: Adrenal glands are unremarkable. Kidneys are normal, without renal calculi, focal lesion, or hydronephrosis. Bladder is unremarkable. Stomach/Bowel: The stomach is nonenlarged. Small bowel does not appear dilated, however there are fluid-filled distal small bowel loops which demonstrate wall thickening within the central to right lower quadrant of the abdomen. No colon wall thickening. Normal appendix. Scattered sigmoid colon diverticula without acute inflammation Vascular/Lymphatic: Nonaneurysmal aorta. No significantly enlarged lymph nodes Reproductive: Prostate is unremarkable. Other: Negative for free air or free fluid. Small to moderate periumbilical hernia, with fascia of defect measuring 4 cm transverse. The hernia at now contains mesenteries he NV loop of small bowel. There is mild bowel wall thickening of the loop of small bowel in the hernia sac. Mild hazy density within the right lower quadrant mesenteric, in the region of the thickened small bowel loops. Musculoskeletal: No acute or significant  osseous findings. IMPRESSION: 1. Moderate periumbilical hernia, now containing mesenteric fat and a loop of distal small bowel. The small bowel loop contained within the hernia sac demonstrates mild wall thickening. There are additional fluid-filled loops of small bowel in the right lower quadrant of the abdomen which also demonstrate mild wall thickening, there is generalized hazy appearance of the mesentry in the right lower quadrant in the region of thickened small bowel loops. This suggests some degree of incarceration of the hernia and possible vascular compromise. There is no high-grade obstruction. 2. Vague hypodense lesions within the liver. When the patient is clinically stable and able to follow directions and hold their breath (preferably as an outpatient) further evaluation with dedicated abdominal MRI should be considered. Electronically Signed   By: Jasmine Pang M.D.   On: 03/03/2017 19:29    A/P: 46 year old gentleman who has a reducible umbilical hernia. I do not think that he has any vascular compromise (as suggested by the CT read)  of any bowel clinically given his completely benign physical exam, minimal pain component to his  presentation, normal labs and vital signs. Furthermore the haziness of the mesentery is associated with bowel that is not involved in the hernia. His complaints are more consistent with enteritis-type picture or perhaps irritable bowel disease. I do recommend that he get his hernia repaired in the near future but I do not feel that this needs to be done emergently based on the fact that it is reducible.    Ayo Guarino, MD [facility], [place] Pager [phone]

## 2017-03-03 NOTE — Discharge Instructions (Signed)
Avoid milk or foods containing milk such as cheese or ice cream all having diarrhea.Make sure that you drink at least six 8 ounce glasses of water or Gatorade each day in order to stay well-hydrated. Take Imodium for diarrhea as directed. Take the medication prescribed as needed for nausea Your blood pressure is elevated today at 158/110 and should be rechecked in the next one or 2 weeks. Call the number on these instructions to get a primary care physician or you can get your blood pressure checked at an urgent care center. Call [facility] to arrange to get your hernia repaired when it is convenient for you. Return to the emergency department your hernia becomes more painful or if you're unable to push it back into place or if concern for any reason

## 2017-03-03 NOTE — ED Provider Notes (Signed)
[facility]-EMERGENCY DEPT Provider Note   CSN: [id number] Arrival date & time: 03/03/17  1412     History   Chief Complaint Chief Complaint  Patient presents with  . Abdominal Pain    HPI Christopher Schultz is a 46 y.o. male.  HPI Complains of nausea vomiting and diarrhea for the past 2 weeks. He reports that he had 4 episodes of vomiting today and a positive for episodes of diarrhea. Other associated symptoms include diminished appetite and soreness around his umbilical hernia that he's had for several years. No fever. Nothing makes symptoms better or worse. No other associated symptoms. No treatment prior to coming here. He denies nausea at present. He states he noted a few flecks of blood in diarrhea 2 days ago, none today and slight flecks of blood in emesis 2 days ago, none today Past Medical History:  Diagnosis Date  . Hernia, abdominal     There are no active problems to display for this patient.   Past Surgical History:  Procedure Laterality Date  . KNEE SURGERY     left       Home Medications    Prior to Admission medications   Medication Sig Start Date End Date Taking? Authorizing Provider  phenazopyridine (PYRIDIUM) 200 MG tablet Take 1 tablet (200 mg total) by mouth 3 (three) times daily as needed for pain (bladder spasm). Patient not taking: Reported on 03/03/2017 09/01/15   Trixie Dredge, PA-C    Family History History reviewed. No pertinent family history.  Social History Social History  Substance Use Topics  . Smoking status: Never Smoker  . Smokeless tobacco: Not on file  . Alcohol use Yes     Comment: ocassionally    No illicit drug use Allergies   Patient has no known allergies.   Review of Systems Review of Systems  Constitutional: Positive for appetite change.  HENT: Negative.   Respiratory: Negative.   Cardiovascular: Negative.   Gastrointestinal: Positive for abdominal pain, diarrhea, nausea and vomiting.  Musculoskeletal: Negative.     Skin: Negative.   Neurological: Negative.   Psychiatric/Behavioral: Negative.   All other systems reviewed and are negative.    Physical Exam Updated Vital Signs BP (!) 178/126 (BP Location: Left Arm)   Pulse 92   Temp 98.8 F (37.1 C) (Oral)   Resp 18   Ht 6' (1.829 m)   Wt 128.4 kg (283 lb)   SpO2 99%   BMI 38.38 kg/m   Physical Exam  Constitutional: He appears well-developed and well-nourished. No distress.  HENT:  Head: Normocephalic and atraumatic.  Eyes: Pupils are equal, round, and reactive to light. Conjunctivae are normal.  Neck: Neck supple. No tracheal deviation present. No thyromegaly present.  Cardiovascular: Normal rate and regular rhythm.   No murmur heard. Pulmonary/Chest: Effort normal and breath sounds normal.  Abdominal: Soft. Bowel sounds are normal. He exhibits no distension. There is no tenderness.  Morbidly obese umbilical hernia which is easily reducible not warm or red, minimally tender   Genitourinary:  Genitourinary Comments: Normal male genitalia  Musculoskeletal: Normal range of motion. He exhibits no edema or tenderness.  Neurological: He is alert. Coordination normal.  Skin: Skin is warm and dry. No rash noted.  Psychiatric: He has a normal mood and affect.  Nursing note and vitals reviewed.    ED Treatments / Results  Labs (all labs ordered are listed, but only abnormal results are displayed) Labs Reviewed  URINALYSIS, ROUTINE W REFLEX MICROSCOPIC - Abnormal; Notable  for the following:       Result Value   APPearance HAZY (*)    Leukocytes, UA TRACE (*)    Bacteria, UA RARE (*)    All other components within normal limits  LIPASE, BLOOD  COMPREHENSIVE METABOLIC PANEL  CBC    EKG  EKG Interpretation None      Results for orders placed or performed during the hospital encounter of 03/03/17  Lipase, blood  Result Value Ref Range   Lipase 35 11 - 51 U/L  Comprehensive metabolic panel  Result Value Ref Range   Sodium 141  135 - 145 mmol/L   Potassium 3.5 3.5 - 5.1 mmol/L   Chloride 104 101 - 111 mmol/L   CO2 24 22 - 32 mmol/L   Glucose, Bld 94 65 - 99 mg/dL   BUN 13 6 - 20 mg/dL   Creatinine, Ser 4.09 0.61 - 1.24 mg/dL   Calcium 9.1 8.9 - 81.1 mg/dL   Total Protein 7.4 6.5 - 8.1 g/dL   Albumin 4.0 3.5 - 5.0 g/dL   AST 16 15 - 41 U/L   ALT 22 17 - 63 U/L   Alkaline Phosphatase 71 38 - 126 U/L   Total Bilirubin 0.4 0.3 - 1.2 mg/dL   GFR calc non Af Amer >60 >60 mL/min   GFR calc Af Amer >60 >60 mL/min   Anion gap 13 5 - 15  CBC  Result Value Ref Range   WBC 7.8 4.0 - 10.5 K/uL   RBC 5.04 4.22 - 5.81 MIL/uL   Hemoglobin 15.1 13.0 - 17.0 g/dL   HCT 91.4 78.2 - 95.6 %   MCV 88.9 78.0 - 100.0 fL   MCH 30.0 26.0 - 34.0 pg   MCHC 33.7 30.0 - 36.0 g/dL   RDW 21.3 08.6 - 57.8 %   Platelets 339 150 - 400 K/uL  Urinalysis, Routine w reflex microscopic  Result Value Ref Range   Color, Urine YELLOW YELLOW   APPearance HAZY (A) CLEAR   Specific Gravity, Urine 1.026 1.005 - 1.030   pH 5.0 5.0 - 8.0   Glucose, UA NEGATIVE NEGATIVE mg/dL   Hgb urine dipstick NEGATIVE NEGATIVE   Bilirubin Urine NEGATIVE NEGATIVE   Ketones, ur NEGATIVE NEGATIVE mg/dL   Protein, ur NEGATIVE NEGATIVE mg/dL   Nitrite NEGATIVE NEGATIVE   Leukocytes, UA TRACE (A) NEGATIVE   RBC / HPF 0-5 0 - 5 RBC/hpf   WBC, UA 0-5 0 - 5 WBC/hpf   Bacteria, UA RARE (A) NONE SEEN   Squamous Epithelial / LPF NONE SEEN NONE SEEN   Mucus PRESENT    Ca Oxalate Crys, UA PRESENT    Ct Abdomen Pelvis W Contrast  Result Date: 03/03/2017 CLINICAL DATA:  Unable to reduce umbilical hernia, pain with nausea vomiting and diarrhea EXAM: CT ABDOMEN AND PELVIS WITH CONTRAST TECHNIQUE: Multidetector CT imaging of the abdomen and pelvis was performed using the standard protocol following bolus administration of intravenous contrast. CONTRAST:  ISOVUE-300 IOPAMIDOL (ISOVUE-300) INJECTION 61% COMPARISON:  09/01/2015 FINDINGS: Lower chest: No acute  consolidation or pleural effusion. Normal heart size. Hepatobiliary: 12 mm vague hypodense lesion near the intra hepatic IVC. Additional vague hypodensity within the right hepatic lobe Negative for calcified gallbladder stones are biliary dilatation. Pancreas: Unremarkable. No pancreatic ductal dilatation or surrounding inflammatory changes. Spleen: Normal in size without focal abnormality. Adrenals/Urinary Tract: Adrenal glands are unremarkable. Kidneys are normal, without renal calculi, focal lesion, or hydronephrosis. Bladder is unremarkable. Stomach/Bowel: The stomach  is nonenlarged. Small bowel does not appear dilated, however there are fluid-filled distal small bowel loops which demonstrate wall thickening within the central to right lower quadrant of the abdomen. No colon wall thickening. Normal appendix. Scattered sigmoid colon diverticula without acute inflammation Vascular/Lymphatic: Nonaneurysmal aorta. No significantly enlarged lymph nodes Reproductive: Prostate is unremarkable. Other: Negative for free air or free fluid. Small to moderate periumbilical hernia, with fascia of defect measuring 4 cm transverse. The hernia at now contains mesenteries he NV loop of small bowel. There is mild bowel wall thickening of the loop of small bowel in the hernia sac. Mild hazy density within the right lower quadrant mesenteric, in the region of the thickened small bowel loops. Musculoskeletal: No acute or significant osseous findings. IMPRESSION: 1. Moderate periumbilical hernia, now containing mesenteric fat and a loop of distal small bowel. The small bowel loop contained within the hernia sac demonstrates mild wall thickening. There are additional fluid-filled loops of small bowel in the right lower quadrant of the abdomen which also demonstrate mild wall thickening, there is generalized hazy appearance of the mesentry in the right lower quadrant in the region of thickened small bowel loops. This suggests some  degree of incarceration of the hernia and possible vascular compromise. There is no high-grade obstruction. 2. Vague hypodense lesions within the liver. When the patient is clinically stable and able to follow directions and hold their breath (preferably as an outpatient) further evaluation with dedicated abdominal MRI should be considered. Electronically Signed   By: Jasmine Pang M.D.   On: 03/03/2017 19:21   Radiology No results found.  Procedures Procedures (including critical care time)  Medications Ordered in ED Medications  sodium chloride 0.9 % bolus 1,000 mL (not administered)  iopamidol (ISOVUE-300) 61 % injection (not administered)     Initial Impression / Assessment and Plan / ED Course  I have reviewed the triage vital signs and the nursing notes.  Pertinent labs & imaging results that were available during my care of the patient were reviewed by me and considered in my medical decision making (see chart for details).     7:45 PM patient resting comfortably. Declines pain medicine. I've consulted Dr. Doylene Canard at [facility] surgery who will come to evaluate patient in the emergency department 8:55 PM patient resting comfortably. Able to Drink water without nausea or vomiting. Clinically patient does not have incarcerated hernia. Plan referral [facility] to have umbilical hernia repaired electively if he chooses. Referral primary care. Imodium for diarrhea. Encourage oral hydration. No dairy all having diarrhea. Blood pressure recheck one week. Final Clinical Impressions(s) / ED Diagnoses  Diagnosis #1 nausea vomiting diarrhea #2 umbilical hernia  #3 elevated blood pressure Final diagnoses:  None    New Prescriptions New Prescriptions   No medications on file     Doug Sou, MD 03/03/17 2052

## 2017-05-30 DIAGNOSIS — R7303 Prediabetes: Secondary | ICD-10-CM

## 2017-05-30 DIAGNOSIS — I10 Essential (primary) hypertension: Secondary | ICD-10-CM

## 2017-05-30 HISTORY — DX: Prediabetes: R73.03

## 2017-05-30 HISTORY — DX: Essential (primary) hypertension: I10

## 2017-08-02 ENCOUNTER — Emergency Department (HOSPITAL_COMMUNITY)
Admission: EM | Admit: 2017-08-02 | Discharge: 2017-08-02 | Disposition: A | Discharge status: Home / Self Care | Payer: Self-pay | Attending: Emergency Medicine | Admitting: Emergency Medicine

## 2017-08-02 ENCOUNTER — Encounter (HOSPITAL_COMMUNITY): Payer: Self-pay | Admitting: *Deleted

## 2017-08-02 ENCOUNTER — Other Ambulatory Visit: Payer: Self-pay

## 2017-08-02 DIAGNOSIS — K429 Umbilical hernia without obstruction or gangrene: Secondary | ICD-10-CM

## 2017-08-02 DIAGNOSIS — Z79899 Other long term (current) drug therapy: Secondary | ICD-10-CM | POA: Insufficient documentation

## 2017-08-02 NOTE — ED Provider Notes (Signed)
[facility]-EMERGENCY DEPT Provider Note   CSN: [id number] Arrival date & time: 08/02/17  8295     History   Chief Complaint Chief Complaint  Patient presents with  . Abdominal Pain    abd hernia    HPI Christopher Schultz is a 47 y.o. male.  47 yo M with a chief complaint of abdominal pain.  He has been having pain from a hernia side for the past few days.  He is to be able to reduce it on his own, but he had some difficulty over the past couple days reducing it.  He has been able to reduce it but usually it comes back out.  He denies nausea or vomiting.  Denies constipation.  Denies diarrhea.  Denies trauma.   The history is provided by the patient.  Illness  This is a new problem. The current episode started more than 2 days ago. The problem occurs constantly. The problem has been gradually worsening. Associated symptoms include abdominal pain. Pertinent negatives include no chest pain, no headaches and no shortness of breath. Nothing aggravates the symptoms. Nothing relieves the symptoms. He has tried nothing for the symptoms. The treatment provided no relief.    Past Medical History:  Diagnosis Date  . Hernia, abdominal     There are no active problems to display for this patient.   Past Surgical History:  Procedure Laterality Date  . KNEE SURGERY     left       Home Medications    Prior to Admission medications   Medication Sig Start Date End Date Taking? Authorizing Provider  naproxen sodium (ALEVE) 220 MG tablet Take 440 mg by mouth 2 (two) times daily as needed (pain).   Yes [provider]  metoCLOPramide (REGLAN) 10 MG tablet Take 1 tablet (10 mg total) by mouth every 6 (six) hours as needed for nausea. Patient not taking: Reported on 08/02/2017 03/03/17   Doug Sou, MD  phenazopyridine (PYRIDIUM) 200 MG tablet Take 1 tablet (200 mg total) by mouth 3 (three) times daily as needed for pain (bladder spasm). Patient not taking:  Reported on 03/03/2017 09/01/15   Trixie Dredge, PA-C    Family History No family history on file.  Social History Social History   Tobacco Use  . Smoking status: Never Smoker  . Smokeless tobacco: Never Used  Substance Use Topics  . Alcohol use: Yes    Comment: ocassionally  . Drug use: No    Comment: x 1 month     Allergies   Patient has no known allergies.   Review of Systems Review of Systems  Constitutional: Negative for chills and fever.  HENT: Negative for congestion and facial swelling.   Eyes: Negative for discharge and visual disturbance.  Respiratory: Negative for shortness of breath.   Cardiovascular: Negative for chest pain and palpitations.  Gastrointestinal: Positive for abdominal pain. Negative for diarrhea and vomiting.  Musculoskeletal: Negative for arthralgias and myalgias.  Skin: Negative for color change and rash.  Neurological: Negative for tremors, syncope and headaches.  Psychiatric/Behavioral: Negative for confusion and dysphoric mood.     Physical Exam Updated Vital Signs BP (!) 164/119 (BP Location: Right Arm)   Pulse 85   Temp 98.7 F (37.1 C) (Oral)   Resp 16   Ht 6' (1.829 m)   Wt 127 kg (280 lb)   SpO2 99%   BMI 37.97 kg/m   Physical Exam  Constitutional: He is oriented to person, place, and  time. He appears well-developed and well-nourished.  HENT:  Head: Normocephalic and atraumatic.  Eyes: EOM are normal. Pupils are equal, round, and reactive to light.  Neck: Normal range of motion. Neck supple. No JVD present.  Cardiovascular: Normal rate and regular rhythm. Exam reveals no gallop and no friction rub.  No murmur heard. Pulmonary/Chest: No respiratory distress. He has no wheezes.  Abdominal: He exhibits no distension. There is no rebound and no guarding.  Periumbilical hernia no erythema to the skin easily reduced  Musculoskeletal: Normal range of motion.  Neurological: He is alert and oriented to person, place, and time.    Skin: No rash noted. No pallor.  Psychiatric: He has a normal mood and affect. His behavior is normal.  Nursing note and vitals reviewed.    ED Treatments / Results  Labs (all labs ordered are listed, but only abnormal results are displayed) Labs Reviewed - No data to display  EKG  EKG Interpretation None       Radiology No results found.  Procedures Procedures (including critical care time)  Medications Ordered in ED Medications - No data to display   Initial Impression / Assessment and Plan / ED Course  I have reviewed the triage vital signs and the nursing notes.  Pertinent labs & imaging results that were available during my care of the patient were reviewed by me and considered in my medical decision making (see chart for details).     47 yo M with a chief complaint of symptomatic hernia.  This is easily reduced at bedside.  I discussed the home care.  Patient has a weight belt that he has been using, we will have him follow-up with general surgery.  12:59 PM:  I have discussed the diagnosis/risks/treatment options with the patient and family and believe the pt to be eligible for discharge home to follow-up with Gen surgery. We also discussed returning to the ED immediately if new or worsening sx occur. We discussed the sx which are most concerning (e.g., sudden worsening pain, fever, inability to tolerate by mouth, inability to reduce the hernia, redness to the skin.) that necessitate immediate return. Medications administered to the patient during their visit and any new prescriptions provided to the patient are listed below.  Medications given during this visit Medications - No data to display   The patient appears reasonably screen and/or stabilized for discharge and I doubt any other medical condition or other [facility] requiring further screening, evaluation, or treatment in the ED at this time prior to discharge.    Final Clinical Impressions(s) / ED Diagnoses    Final diagnoses:  Umbilical hernia without obstruction and without gangrene    ED Discharge Orders    None       Melene PlanFloyd, Romari Gasparro, DO 08/02/17 1259

## 2017-08-02 NOTE — Discharge Instructions (Signed)
Take 4 over the counter ibuprofen tablets 3 times a day or 2 over-the-counter naproxen tablets twice a day for pain. Also take tylenol 1000mg(2 extra strength) four times a day.    

## 2017-08-02 NOTE — ED Triage Notes (Signed)
Pt has lower abd hernia, ususally able to push in and it will stay in, recently it keeps popping back out, pain in area.

## 2017-08-11 ENCOUNTER — Emergency Department (HOSPITAL_COMMUNITY): Payer: Self-pay | Admitting: Anesthesiology

## 2017-08-11 ENCOUNTER — Other Ambulatory Visit: Payer: Self-pay

## 2017-08-11 ENCOUNTER — Encounter (HOSPITAL_COMMUNITY): Admission: EM | Disposition: A | Discharge status: Home / Self Care | Payer: Self-pay | Source: Home / Self Care

## 2017-08-11 ENCOUNTER — Encounter (HOSPITAL_COMMUNITY): Payer: Self-pay

## 2017-08-11 ENCOUNTER — Inpatient Hospital Stay (HOSPITAL_COMMUNITY)
Admission: EM | Admit: 2017-08-11 | Discharge: 2017-08-14 | DRG: 354 | Disposition: A | Discharge status: Home / Self Care | Payer: Self-pay | Attending: General Surgery | Admitting: General Surgery

## 2017-08-11 DIAGNOSIS — K420 Umbilical hernia with obstruction, without gangrene: Principal | ICD-10-CM | POA: Diagnosis present

## 2017-08-11 DIAGNOSIS — Z9989 Dependence on other enabling machines and devices: Secondary | ICD-10-CM

## 2017-08-11 DIAGNOSIS — R7303 Prediabetes: Secondary | ICD-10-CM | POA: Diagnosis present

## 2017-08-11 DIAGNOSIS — Z8249 Family history of ischemic heart disease and other diseases of the circulatory system: Secondary | ICD-10-CM

## 2017-08-11 DIAGNOSIS — I10 Essential (primary) hypertension: Secondary | ICD-10-CM | POA: Diagnosis present

## 2017-08-11 DIAGNOSIS — Z6841 Body Mass Index (BMI) 40.0 and over, adult: Secondary | ICD-10-CM

## 2017-08-11 DIAGNOSIS — E875 Hyperkalemia: Secondary | ICD-10-CM | POA: Diagnosis present

## 2017-08-11 DIAGNOSIS — Z6837 Body mass index (BMI) 37.0-37.9, adult: Secondary | ICD-10-CM

## 2017-08-11 DIAGNOSIS — G4733 Obstructive sleep apnea (adult) (pediatric): Secondary | ICD-10-CM

## 2017-08-11 DIAGNOSIS — I5030 Unspecified diastolic (congestive) heart failure: Secondary | ICD-10-CM | POA: Diagnosis present

## 2017-08-11 DIAGNOSIS — I110 Hypertensive heart disease with heart failure: Secondary | ICD-10-CM | POA: Diagnosis present

## 2017-08-11 DIAGNOSIS — R739 Hyperglycemia, unspecified: Secondary | ICD-10-CM | POA: Diagnosis present

## 2017-08-11 DIAGNOSIS — K429 Umbilical hernia without obstruction or gangrene: Secondary | ICD-10-CM | POA: Diagnosis present

## 2017-08-11 HISTORY — DX: Prediabetes: R73.03

## 2017-08-11 HISTORY — DX: Dependence on other enabling machines and devices: Z99.89

## 2017-08-11 HISTORY — PX: ABDOMINAL HERNIA REPAIR: SHX539

## 2017-08-11 HISTORY — PX: UMBILICAL HERNIA REPAIR: SHX196

## 2017-08-11 HISTORY — DX: Obstructive sleep apnea (adult) (pediatric): G47.33

## 2017-08-11 HISTORY — DX: Essential (primary) hypertension: I10

## 2017-08-11 HISTORY — DX: Morbid (severe) obesity due to excess calories: E66.01

## 2017-08-11 HISTORY — DX: Body Mass Index (BMI) 40.0 and over, adult: Z684

## 2017-08-11 LAB — CBC WITH DIFFERENTIAL/PLATELET
Basophils Absolute: 0 10*3/uL (ref 0.0–0.1)
Basophils Relative: 0 %
EOS PCT: 0 %
Eosinophils Absolute: 0 10*3/uL (ref 0.0–0.7)
HCT: 48.8 % (ref 39.0–52.0)
HEMOGLOBIN: 16.3 g/dL (ref 13.0–17.0)
LYMPHS ABS: 1.3 10*3/uL (ref 0.7–4.0)
LYMPHS PCT: 19 %
MCH: 30 pg (ref 26.0–34.0)
MCHC: 33.4 g/dL (ref 30.0–36.0)
MCV: 89.7 fL (ref 78.0–100.0)
MONOS PCT: 4 %
Monocytes Absolute: 0.3 10*3/uL (ref 0.1–1.0)
Neutro Abs: 5.3 10*3/uL (ref 1.7–7.7)
Neutrophils Relative %: 77 %
PLATELETS: 285 10*3/uL (ref 150–400)
RBC: 5.44 MIL/uL (ref 4.22–5.81)
RDW: 14.3 % (ref 11.5–15.5)
WBC: 6.9 10*3/uL (ref 4.0–10.5)

## 2017-08-11 LAB — BASIC METABOLIC PANEL
Anion gap: 12 (ref 5–15)
BUN: 12 mg/dL (ref 6–20)
CHLORIDE: 106 mmol/L (ref 101–111)
CO2: 22 mmol/L (ref 22–32)
Calcium: 9.7 mg/dL (ref 8.9–10.3)
Creatinine, Ser: 1.29 mg/dL — ABNORMAL HIGH (ref 0.61–1.24)
GFR calc Af Amer: >60 mL/min (ref >60)
GFR calc non Af Amer: >60 mL/min (ref >60)
Glucose, Bld: 95 mg/dL (ref 65–99)
POTASSIUM: 4 mmol/L (ref 3.5–5.1)
SODIUM: 140 mmol/L (ref 135–145)

## 2017-08-11 SURGERY — REPAIR, HERNIA, UMBILICAL, ADULT
Anesthesia: General | Site: Abdomen

## 2017-08-11 MED ORDER — ONDANSETRON HCL 4 MG/2ML IJ SOLN: 4.0000 mg | Freq: Four times a day (QID) | INTRAMUSCULAR | Status: DC | PRN

## 2017-08-11 MED ORDER — BUPIVACAINE-EPINEPHRINE (PF) 0.25% -1:200000 IJ SOLN
INTRAMUSCULAR | Status: AC
  Filled 2017-08-11: qty 30

## 2017-08-11 MED ORDER — SUCCINYLCHOLINE CHLORIDE 20 MG/ML IJ SOLN
INTRAMUSCULAR | Status: DC | PRN
  Administered 2017-08-11: 100 mg via INTRAVENOUS

## 2017-08-11 MED ORDER — LABETALOL HCL 5 MG/ML IV SOLN
5.0000 mg | INTRAVENOUS | Status: DC | PRN
  Administered 2017-08-11: 5 mg via INTRAVENOUS

## 2017-08-11 MED ORDER — KETOROLAC TROMETHAMINE 30 MG/ML IJ SOLN
30.0000 mg | Freq: Four times a day (QID) | INTRAMUSCULAR | Status: DC
  Administered 2017-08-11 – 2017-08-14 (×11): 30 mg via INTRAVENOUS
  Filled 2017-08-11 (×11): qty 1

## 2017-08-11 MED ORDER — BUPIVACAINE-EPINEPHRINE 0.25% -1:200000 IJ SOLN
INTRAMUSCULAR | Status: DC | PRN
  Administered 2017-08-11: 30 mL

## 2017-08-11 MED ORDER — LABETALOL HCL 5 MG/ML IV SOLN
INTRAVENOUS | Status: DC | PRN
Start: 2017-08-11 — End: 2017-08-11
  Administered 2017-08-11: 5 mg via INTRAVENOUS
  Administered 2017-08-11: 10 mg via INTRAVENOUS

## 2017-08-11 MED ORDER — LIDOCAINE HCL (CARDIAC) 20 MG/ML IV SOLN
INTRAVENOUS | Status: AC
  Filled 2017-08-11: qty 5

## 2017-08-11 MED ORDER — LABETALOL HCL 5 MG/ML IV SOLN
INTRAVENOUS | Status: AC
  Filled 2017-08-11: qty 4

## 2017-08-11 MED ORDER — ROCURONIUM BROMIDE 100 MG/10ML IV SOLN
INTRAVENOUS | Status: DC | PRN
  Administered 2017-08-11: 50 mg via INTRAVENOUS
  Administered 2017-08-11 (×3): 10 mg via INTRAVENOUS

## 2017-08-11 MED ORDER — PROPOFOL 10 MG/ML IV BOLUS
INTRAVENOUS | Status: AC
  Filled 2017-08-11: qty 20

## 2017-08-11 MED ORDER — KCL IN DEXTROSE-NACL 20-5-0.45 MEQ/L-%-% IV SOLN
INTRAVENOUS | Status: DC
  Administered 2017-08-11 – 2017-08-12 (×2): via INTRAVENOUS
  Filled 2017-08-11 (×3): qty 1000

## 2017-08-11 MED ORDER — OXYCODONE HCL 5 MG PO TABS: 5.0000 mg | ORAL_TABLET | ORAL | Status: DC | PRN

## 2017-08-11 MED ORDER — HYDROMORPHONE HCL 1 MG/ML IJ SOLN
0.5000 mg | Freq: Once | INTRAMUSCULAR | Status: AC
  Administered 2017-08-11: 0.5 mg via INTRAVENOUS
  Filled 2017-08-11: qty 1

## 2017-08-11 MED ORDER — OXYCODONE HCL 5 MG PO TABS: 5.0000 mg | ORAL_TABLET | Freq: Once | ORAL | Status: DC | PRN

## 2017-08-11 MED ORDER — ACETAMINOPHEN 500 MG PO TABS
1000.0000 mg | ORAL_TABLET | Freq: Four times a day (QID) | ORAL | Status: DC
  Administered 2017-08-11 – 2017-08-14 (×11): 1000 mg via ORAL
  Filled 2017-08-11 (×11): qty 2

## 2017-08-11 MED ORDER — LACTATED RINGERS IV SOLN
INTRAVENOUS | Status: DC
  Administered 2017-08-11 (×3): via INTRAVENOUS

## 2017-08-11 MED ORDER — CEFOTETAN DISODIUM-DEXTROSE 2-2.08 GM-%(50ML) IV SOLR
INTRAVENOUS | Status: AC
  Filled 2017-08-11: qty 50

## 2017-08-11 MED ORDER — POLYETHYLENE GLYCOL 3350 17 G PO PACK: 17.0000 g | PACK | Freq: Every day | ORAL | Status: DC | PRN

## 2017-08-11 MED ORDER — DIPHENHYDRAMINE HCL 25 MG PO CAPS: 25.0000 mg | ORAL_CAPSULE | Freq: Four times a day (QID) | ORAL | Status: DC | PRN

## 2017-08-11 MED ORDER — LABETALOL HCL 5 MG/ML IV SOLN
15.0000 mg | Freq: Once | INTRAVENOUS | Status: AC
  Administered 2017-08-11: 15 mg via INTRAVENOUS

## 2017-08-11 MED ORDER — 0.9 % SODIUM CHLORIDE (POUR BTL) OPTIME
TOPICAL | Status: DC | PRN
  Administered 2017-08-11: 1000 mL

## 2017-08-11 MED ORDER — MORPHINE SULFATE (PF) 4 MG/ML IV SOLN: 2.0000 mg | INTRAVENOUS | Status: DC | PRN

## 2017-08-11 MED ORDER — SODIUM CHLORIDE 0.9 % IV SOLN
2.0000 g | INTRAVENOUS | Status: AC
  Administered 2017-08-11: 2 g via INTRAVENOUS

## 2017-08-11 MED ORDER — LIDOCAINE HCL (CARDIAC) 20 MG/ML IV SOLN
INTRAVENOUS | Status: DC | PRN
  Administered 2017-08-11: 80 mg via INTRAVENOUS

## 2017-08-11 MED ORDER — ACETAMINOPHEN 325 MG PO TABS: 325.0000 mg | ORAL_TABLET | ORAL | Status: DC | PRN

## 2017-08-11 MED ORDER — PROPOFOL 1000 MG/100ML IV EMUL
INTRAVENOUS | Status: AC
  Filled 2017-08-11: qty 100

## 2017-08-11 MED ORDER — LORAZEPAM 2 MG/ML IJ SOLN
0.5000 mg | Freq: Once | INTRAMUSCULAR | Status: AC
  Administered 2017-08-11: 0.5 mg via INTRAVENOUS
  Filled 2017-08-11: qty 1

## 2017-08-11 MED ORDER — SODIUM CHLORIDE 0.9 % IV BOLUS (SEPSIS)
1000.0000 mL | Freq: Once | INTRAVENOUS | Status: AC
  Administered 2017-08-11: 1000 mL via INTRAVENOUS

## 2017-08-11 MED ORDER — FENTANYL CITRATE (PF) 100 MCG/2ML IJ SOLN
INTRAMUSCULAR | Status: DC | PRN
  Administered 2017-08-11: 150 ug via INTRAVENOUS
  Administered 2017-08-11 (×2): 50 ug via INTRAVENOUS

## 2017-08-11 MED ORDER — SUGAMMADEX SODIUM 500 MG/5ML IV SOLN
INTRAVENOUS | Status: DC | PRN
  Administered 2017-08-11: 300 mg via INTRAVENOUS

## 2017-08-11 MED ORDER — TRAMADOL HCL 50 MG PO TABS
50.0000 mg | ORAL_TABLET | Freq: Four times a day (QID) | ORAL | Status: DC | PRN
  Administered 2017-08-12: 50 mg via ORAL
  Filled 2017-08-11: qty 1

## 2017-08-11 MED ORDER — ACETAMINOPHEN 160 MG/5ML PO SOLN: 325.0000 mg | ORAL | Status: DC | PRN

## 2017-08-11 MED ORDER — MIDAZOLAM HCL 2 MG/2ML IJ SOLN
INTRAMUSCULAR | Status: AC
  Filled 2017-08-11: qty 2

## 2017-08-11 MED ORDER — FENTANYL CITRATE (PF) 100 MCG/2ML IJ SOLN: 25.0000 ug | INTRAMUSCULAR | Status: DC | PRN

## 2017-08-11 MED ORDER — MIDAZOLAM HCL 5 MG/5ML IJ SOLN
INTRAMUSCULAR | Status: DC | PRN
  Administered 2017-08-11: 2 mg via INTRAVENOUS

## 2017-08-11 MED ORDER — ONDANSETRON 4 MG PO TBDP: 4.0000 mg | ORAL_TABLET | Freq: Four times a day (QID) | ORAL | Status: DC | PRN

## 2017-08-11 MED ORDER — ENOXAPARIN SODIUM 40 MG/0.4ML ~~LOC~~ SOLN
40.0000 mg | Freq: Every day | SUBCUTANEOUS | Status: DC
  Administered 2017-08-12 – 2017-08-14 (×3): 40 mg via SUBCUTANEOUS
  Filled 2017-08-11 (×3): qty 0.4

## 2017-08-11 MED ORDER — METOPROLOL TARTRATE 5 MG/5ML IV SOLN
INTRAVENOUS | Status: AC
  Filled 2017-08-11: qty 5

## 2017-08-11 MED ORDER — KETOROLAC TROMETHAMINE 30 MG/ML IJ SOLN: 30.0000 mg | Freq: Four times a day (QID) | INTRAMUSCULAR | Status: DC | PRN

## 2017-08-11 MED ORDER — HYDRALAZINE HCL 20 MG/ML IJ SOLN
INTRAMUSCULAR | Status: AC
  Filled 2017-08-11: qty 1

## 2017-08-11 MED ORDER — ROCURONIUM BROMIDE 10 MG/ML (PF) SYRINGE
PREFILLED_SYRINGE | INTRAVENOUS | Status: AC
  Filled 2017-08-11: qty 5

## 2017-08-11 MED ORDER — HYDRALAZINE HCL 20 MG/ML IJ SOLN: 10.0000 mg | INTRAMUSCULAR | Status: DC | PRN

## 2017-08-11 MED ORDER — DIPHENHYDRAMINE HCL 50 MG/ML IJ SOLN: 25.0000 mg | Freq: Four times a day (QID) | INTRAMUSCULAR | Status: DC | PRN

## 2017-08-11 MED ORDER — FENTANYL CITRATE (PF) 250 MCG/5ML IJ SOLN
INTRAMUSCULAR | Status: AC
  Filled 2017-08-11: qty 5

## 2017-08-11 MED ORDER — HYDRALAZINE HCL 20 MG/ML IJ SOLN
10.0000 mg | Freq: Once | INTRAMUSCULAR | Status: AC
  Administered 2017-08-11: 10 mg via INTRAVENOUS

## 2017-08-11 MED ORDER — GABAPENTIN 300 MG PO CAPS
300.0000 mg | ORAL_CAPSULE | Freq: Two times a day (BID) | ORAL | Status: DC
  Administered 2017-08-11 – 2017-08-14 (×6): 300 mg via ORAL
  Filled 2017-08-11 (×6): qty 1

## 2017-08-11 MED ORDER — OXYCODONE HCL 5 MG/5ML PO SOLN: 5.0000 mg | Freq: Once | ORAL | Status: DC | PRN

## 2017-08-11 MED ORDER — ONDANSETRON HCL 4 MG/2ML IJ SOLN
INTRAMUSCULAR | Status: DC | PRN
  Administered 2017-08-11: 4 mg via INTRAVENOUS

## 2017-08-11 MED ORDER — METOPROLOL TARTRATE 5 MG/5ML IV SOLN
2.5000 mg | Freq: Once | INTRAVENOUS | Status: AC
  Administered 2017-08-11: 2.5 mg via INTRAVENOUS

## 2017-08-11 MED ORDER — PROPOFOL 10 MG/ML IV BOLUS
INTRAVENOUS | Status: DC | PRN
  Administered 2017-08-11: 200 mg via INTRAVENOUS

## 2017-08-11 MED ORDER — METOPROLOL TARTRATE 5 MG/5ML IV SOLN: 5.0000 mg | Freq: Four times a day (QID) | INTRAVENOUS | Status: DC | PRN

## 2017-08-11 SURGICAL SUPPLY — 37 items
ADH SKN CLS APL DERMABOND .7 (GAUZE/BANDAGES/DRESSINGS)
BLADE CLIPPER SURG (BLADE) IMPLANT
CANISTER SUCT 3000ML PPV (MISCELLANEOUS) ×2 IMPLANT
CHLORAPREP W/TINT 26ML (MISCELLANEOUS) ×2 IMPLANT
COVER SURGICAL LIGHT HANDLE (MISCELLANEOUS) ×2 IMPLANT
DERMABOND ADVANCED (GAUZE/BANDAGES/DRESSINGS)
DERMABOND ADVANCED .7 DNX12 (GAUZE/BANDAGES/DRESSINGS) ×1 IMPLANT
DRAPE LAPAROTOMY 100X72 PEDS (DRAPES) ×2 IMPLANT
DRSG COVADERM 4X10 (GAUZE/BANDAGES/DRESSINGS) ×1 IMPLANT
ELECT CAUTERY BLADE 6.4 (BLADE) ×1 IMPLANT
ELECT REM PT RETURN 9FT ADLT (ELECTROSURGICAL) ×2
ELECTRODE REM PT RTRN 9FT ADLT (ELECTROSURGICAL) ×1 IMPLANT
GLOVE BIOGEL PI IND STRL 7.0 (GLOVE) ×1 IMPLANT
GLOVE BIOGEL PI INDICATOR 7.0 (GLOVE) ×2
GLOVE SURG SS PI 7.0 STRL IVOR (GLOVE) ×3 IMPLANT
GOWN STRL REUS W/ TWL LRG LVL3 (GOWN DISPOSABLE) ×2 IMPLANT
GOWN STRL REUS W/TWL LRG LVL3 (GOWN DISPOSABLE) ×6
KIT BASIN OR (CUSTOM PROCEDURE TRAY) ×2 IMPLANT
KIT ROOM TURNOVER OR (KITS) ×2 IMPLANT
MESH VENTRALIGHT ST 4X6IN (Mesh General) ×1 IMPLANT
NDL HYPO 25GX1X1/2 BEV (NEEDLE) ×1 IMPLANT
NEEDLE 22X1 1/2 (OR ONLY) (NEEDLE) ×2 IMPLANT
NEEDLE HYPO 25GX1X1/2 BEV (NEEDLE) ×2 IMPLANT
NS IRRIG 1000ML POUR BTL (IV SOLUTION) ×2 IMPLANT
PACK GENERAL/GYN (CUSTOM PROCEDURE TRAY) ×2 IMPLANT
PAD ARMBOARD 7.5X6 YLW CONV (MISCELLANEOUS) ×2 IMPLANT
PENCIL BUTTON HOLSTER BLD 10FT (ELECTRODE) ×1 IMPLANT
STRIP CLOSURE SKIN 1/2X4 (GAUZE/BANDAGES/DRESSINGS) ×1 IMPLANT
SUT MNCRL AB 4-0 PS2 18 (SUTURE) ×2 IMPLANT
SUT NOVA NAB GS-21 0 18 T12 DT (SUTURE) ×1 IMPLANT
SUT PDS AB 0 CT 36 (SUTURE) ×1 IMPLANT
SUT PROLENE 0 CT 1 30 (SUTURE) ×3 IMPLANT
SUT VIC AB 3-0 SH 27 (SUTURE) ×2
SUT VIC AB 3-0 SH 27XBRD (SUTURE) ×1 IMPLANT
SYR CONTROL 10ML LL (SYRINGE) ×2 IMPLANT
TOWEL OR 17X24 6PK STRL BLUE (TOWEL DISPOSABLE) ×2 IMPLANT
TOWEL OR 17X26 10 PK STRL BLUE (TOWEL DISPOSABLE) ×2 IMPLANT

## 2017-08-11 NOTE — Progress Notes (Signed)
Placed patient on CPAP at 10cm with oxygen set at 8lpm. Sp02=88-91%

## 2017-08-11 NOTE — ED Triage Notes (Signed)
PT reports having hernia out x 4-5 hours with severe abdominal pain. Pt reports he has been unable to reduce hernia.

## 2017-08-11 NOTE — Anesthesia Procedure Notes (Signed)
Procedure Name: Intubation Date/Time: 08/11/2017 6:47 PM Performed by: Oletta Lamas, CRNA Pre-anesthesia Checklist: Patient identified, Emergency Drugs available, Suction available and Patient being monitored Patient Re-evaluated:Patient Re-evaluated prior to induction Oxygen Delivery Method: Circle System Utilized Preoxygenation: Pre-oxygenation with 100% oxygen Induction Type: IV induction Ventilation: Mask ventilation without difficulty Laryngoscope Size: Mac and 4 Grade View: Grade I Tube type: Oral Tube size: 7.5 mm Number of attempts: 1 Airway Equipment and Method: Stylet Placement Confirmation: ETT inserted through vocal cords under direct vision,  positive ETCO2 and breath sounds checked- equal and bilateral Secured at: 23 cm Tube secured with: Tape Dental Injury: Teeth and Oropharynx as per pre-operative assessment

## 2017-08-11 NOTE — Anesthesia Postprocedure Evaluation (Signed)
Anesthesia Post Note  Patient: Christopher Schultz  Procedure(s) Performed: HERNIA REPAIR UMBILICAL ADULT OPEN (N/A Abdomen)     Patient location during evaluation: PACU Anesthesia Type: General Level of consciousness: awake and alert Pain management: pain level controlled Vital Signs Assessment: post-procedure vital signs reviewed and stable Respiratory status: spontaneous breathing, nonlabored ventilation, respiratory function stable and patient connected to nasal cannula oxygen Cardiovascular status: blood pressure returned to baseline and stable Postop Assessment: no apparent nausea or vomiting Anesthetic complications: no Comments: Patient initially obstructing and hypertensive. Obstruction post extubation relieved with bilateral nasal trumpets. Patient taken to PACU and transitioned to CPAP. Patient tolerated CPAP at 10cmH20 and removal of trumpets. Oxygenation improved and respiratory status stabilized. Patient with improved mentation and [facility]. HTN treated with multiple agents and improved by time of discharge from PACU. Patient with likely undiagnosed and untreated OSA and HTN. This info relayed to surgical team to potentially get medicine involved for dischrge planning and management.     Last Vitals:  Vitals:   08/11/17 2230 08/11/17 2253  BP: (!) 149/99 (!) 140/92  Pulse: (!) 106 (!) 101  Resp: (!) 23 (!) 21  Temp: 36.7 C 37.2 C  SpO2: 98% 98%    Last Pain:  Vitals:   08/11/17 2253  TempSrc: Oral  PainSc:                  Christopher Schultz

## 2017-08-11 NOTE — Anesthesia Preprocedure Evaluation (Signed)
Anesthesia Evaluation  Patient identified by MRN, date of birth, ID band Patient awake    Reviewed: Allergy & Precautions, NPO status , Patient's Chart, lab work & pertinent test results  History of Anesthesia Complications Negative for: history of anesthetic complications  Airway Mallampati: III  TM Distance: >3 FB Neck ROM: Full    Dental  (+) Teeth Intact   Pulmonary neg pulmonary ROS,    breath sounds clear to auscultation       Cardiovascular negative cardio ROS   Rhythm:Regular     Neuro/Psych negative neurological ROS  negative psych ROS   GI/Hepatic Neg liver ROS, Non-reducible hernia   Endo/Other  Morbid obesity  Renal/GU negative Renal ROS     Musculoskeletal negative musculoskeletal ROS (+)   Abdominal   Peds  Hematology negative hematology ROS (+)   Anesthesia Other Findings   Reproductive/Obstetrics                             Anesthesia Physical Anesthesia Plan  ASA: II and emergent  Anesthesia Plan: General   Post-op Pain Management:    Induction: Intravenous, Rapid sequence and Cricoid pressure planned  PONV Risk Score and Plan: 2 and Ondansetron and Dexamethasone  Airway Management Planned: Oral ETT  Additional Equipment: None  Intra-op Plan:   Post-operative Plan: Extubation in OR  Informed Consent: I have reviewed the patients History and Physical, chart, labs and discussed the procedure including the risks, benefits and alternatives for the proposed anesthesia with the patient or authorized representative who has indicated his/her understanding and acceptance.   Dental advisory given  Plan Discussed with: CRNA and Surgeon  Anesthesia Plan Comments:         Anesthesia Quick Evaluation

## 2017-08-11 NOTE — Progress Notes (Signed)
Received pt from PACU.S/P Open umbilical hernia repair. Dsg to mid abd with marked scant drainage. Alert and oriented x4. Denies c/o pain. O2 at 4LPM/Caroleen with O2sats at 98%. Oriented to room and call bell.

## 2017-08-11 NOTE — Transfer of Care (Signed)
Immediate Anesthesia Transfer of Care Note  Patient: Christopher Schultz  Procedure(s) Performed: HERNIA REPAIR UMBILICAL ADULT OPEN (N/A Abdomen)  Patient Location: PACU  Anesthesia Type:General  Level of Consciousness: awake, oriented, drowsy and patient cooperative  Airway & Oxygen Therapy: Patient Spontanous Breathing and Patient connected to nasal cannula oxygen  Post-op Assessment: Report given to RN and Post -op Vital signs reviewed and stable  Post vital signs: Reviewed and stable  Last Vitals:  Vitals:   08/11/17 1646 08/11/17 1745  BP: (!) 178/113 (!) 167/113  Pulse: 98 99  Resp: 19   Temp:    SpO2: 96% 97%    Last Pain:  Vitals:   08/11/17 1619  TempSrc: Oral  PainSc: 8          Complications: No apparent anesthesia complications   Awake.  Following commands.  Bilateral nasal airways remain in place.  Sat 92% on 6L SFM.

## 2017-08-11 NOTE — Op Note (Signed)
PATIENT:  Christopher Schultz A Paro  47 y.o. male  PRE-OPERATIVE DIAGNOSIS:  Incarcerated umbilical hernia  POST-OPERATIVE DIAGNOSIS:  umbilical hernia with obstruction  PROCEDURE:  Procedure(s): Reduction and repair of incarcerated and obstruction umbilical hernia   SURGEON:  Surgeon(s): Talesha Ellithorpe, De BlanchLuke Aaron, MD  ASSISTANT: none   ANESTHESIA:   local and general  Indications for procedure: Christopher PuntDavid A Monrroy is a 47 y.o. year old male with symptoms of abdominal pain for one day and nausea and vomiting. The hernia was unable to be reduced at the bedside and so decision was made to proceed with repair.  Description of procedure: The patient was brought into the operative suite. Anesthesia was administered with General endotracheal anesthesia. WHO checklist was applied. The patient was then placed in supine. The area was prepped and draped in the usual sterile fashion.  A vertical incision was made directly overlying the hernia area which was to the right of the umbilicus. Cautery and blunt dissection was used to dissect down to the fascia. The hernia sac was dissected free from surrounding tissues in 360 degrees. The hernia was entered and dilated slightly edematous small intestine was identified. The fascial defect was enlarged inferiorly to allow reduction. On re-examining the intestines, no injury or ischemia area was noted. The hernia defect was 5 cm in diameter. The hernia sac was removed. Due to the size of the hernia, a 10 x 15cm ventralight ST mesh was placed as an underlay using 9 0 novafil sutures to anchor the mesh. The fascial defect was then primarily closed with interrupted 0 PDS sutures. The deep dermal space was closed with a 3-0 vicryl. 30 ml 0.25% marcaine with epinephrine was injected into the muscle layer and around the fascia. The skin was closed with a 4-0 monocryl subcuticular suture. Streristrips and coverderm was put in place for dressing. The patient awoke from anesthesia and was  brought to pacu in stable condition. All counts were correct.  Findings: obstructing of small intestine due to umbilical hernia  Specimen: none  Blood loss: 50 ml  Local anesthesia: 30 ml marcaine  Complications: none  PLAN OF CARE: Admit to inpatient   PATIENT DISPOSITION:  PACU - hemodynamically stable.  Feliciana RossettiLuke Faron Whitelock, M.D. General, Bariatric, & Minimally Invasive Surgery [facility] [place]  08/11/2017 8:15 PM

## 2017-08-11 NOTE — ED Provider Notes (Signed)
[facility]   CSN: [id number] Arrival date & time: 08/11/17  1545     History   Chief Complaint Chief Complaint  Patient presents with  . Hernia    HPI Christopher Schultz is a 47 y.o. male the emergency department chief complaint of painful umbilical hernia.  Patient states he has had a hernia for a long time but today he is been unable to reduce it.  He states it is excruciatingly painful, firm and hard.  He denies fevers or chills.  He does nausea or vomiting.  HPI  Past Medical History:  Diagnosis Date  . Hernia, abdominal     There are no active problems to display for this patient.   Past Surgical History:  Procedure Laterality Date  . KNEE SURGERY     left       Home Medications    Prior to Admission medications   Medication Sig Start Date End Date Taking? Authorizing Provider  metoCLOPramide (REGLAN) 10 MG tablet Take 1 tablet (10 mg total) by mouth every 6 (six) hours as needed for nausea. Patient not taking: Reported on 08/02/2017 03/03/17   Doug SouJacubowitz, Sam, MD  naproxen sodium (ALEVE) 220 MG tablet Take 440 mg by mouth 2 (two) times daily as needed (pain).    [provider]  phenazopyridine (PYRIDIUM) 200 MG tablet Take 1 tablet (200 mg total) by mouth 3 (three) times daily as needed for pain (bladder spasm). Patient not taking: Reported on 03/03/2017 09/01/15   Trixie DredgeWest, Emily, PA-C    Family History No family history on file.  Social History Social History   Tobacco Use  . Smoking status: Never Smoker  . Smokeless tobacco: Never Used  Substance Use Topics  . Alcohol use: Yes    Comment: ocassionally  . Drug use: No    Comment: x 1 month     Allergies   Patient has no known allergies.   Review of Systems Review of Systems  Ten systems reviewed and are negative for acute change, except as noted in the HPI.   Physical Exam Updated Vital Signs BP (!) 156/108 (BP Location: Left Arm)    Pulse 100   Temp 98.2 F (36.8 C) (Oral)   Resp 18   SpO2 96%   Physical Exam  Constitutional: He appears well-developed and well-nourished. No distress.  Patient appears to be in severe pain  HENT:  Head: Normocephalic and atraumatic.  Eyes: Conjunctivae are normal. No scleral icterus.  Neck: Normal range of motion. Neck supple.  Cardiovascular: Normal rate, regular rhythm and normal heart sounds.  Pulmonary/Chest: Effort normal and breath sounds normal. No respiratory distress.  Abdominal: Soft. There is tenderness. A hernia is present.  Patient with rotund abdomen, he is seated in a wheelchair.  He is a large, firm, exquisitely tender umbilical hernia.  Musculoskeletal: He exhibits no edema.  Neurological: He is alert.  Skin: Skin is warm and dry. He is not diaphoretic.  Psychiatric: His behavior is normal.  Nursing note and vitals reviewed.    ED Treatments / Results  Labs (all labs ordered are listed, but only abnormal results are displayed) Labs Reviewed  BASIC METABOLIC PANEL  CBC WITH DIFFERENTIAL/PLATELET  I-STAT BETA HCG BLOOD, ED ([facility], [facility], [facility] ONLY)    EKG  EKG Interpretation None       Radiology No results found.  Procedures Hernia reduction Date/Time: 08/11/2017 9:01 PM Performed by: Arthor CaptainHarris, Aj Crunkleton, PA-C Authorized by: Arthor CaptainHarris, Britanny Marksberry,  PA-C  Consent: Verbal consent obtained. Consent given by: patient Patient identity confirmed: verbally with patient and provided demographic data Local anesthesia used: no  Anesthesia: Local anesthesia used: no Comments: Patient placed in Trendelenburg position, ice applied to the abdomen.  I applied firm pressure and redirected pressure several times for approximately 20 minutes with out any improvement or advancement of the hernia into the abdominal cavity.  The hernia is not reduced.  .Critical Care Performed by: Arthor Captain, PA-C Authorized by: Arthor Captain, PA-C   Critical care provider statement:     Critical care time (minutes):  45   Critical care was necessary to treat or prevent imminent or life-threatening deterioration of the following conditions: Incarcerated hernia.   Critical care was time spent personally by me on the following activities:  Review of old charts, re-evaluation of patient's condition, pulse oximetry, ordering and review of radiographic studies, ordering and review of laboratory studies, ordering and performing treatments and interventions, examination of patient, evaluation of patient's response to treatment, discussions with consultants and development of treatment plan with patient or surrogate   (including critical care time)  Medications Ordered in ED Medications  sodium chloride 0.9 % bolus 1,000 mL (not administered)  LORazepam (ATIVAN) injection 0.5 mg (not administered)  HYDROmorphone (DILAUDID) injection 0.5 mg (not administered)     Initial Impression / Assessment and Plan / ED Course  I have reviewed the triage vital signs and the nursing notes.  Pertinent labs & imaging results that were available during my care of the patient were reviewed by me and considered in my medical decision making (see chart for details).     Patient with incarcerated umbilical hernia.  I was unable to reduce this here in the emergency department I spoke with Dr. Corliss Skains who asked his colleague Dr. Sheliah Hatch to consult on the patient.  Also Dr. Sheliah Hatch was unable to reduce the hernia with taken to the OR emergently for reduction.  Final Clinical Impressions(s) / ED Diagnoses   Final diagnoses:  Incarcerated umbilical hernia    ED Discharge Orders    None       Arthor Captain, PA-C 08/11/17 2104    Nira Conn, MD 08/12/17 [id number]

## 2017-08-11 NOTE — ED Notes (Signed)
PA at bedside to reduce Hernia

## 2017-08-11 NOTE — H&P (Signed)
Christopher Schultz is an 47 y.o. male.   Chief Complaint: abdominal pain HPI: 47 yo male with known hernia for years. He presented with acute pain today 7 hours ago. He notes vomiting and nausea. He denies fevers or chills. He has manually reduced the hernia in the past but unable to do so today.  Past Medical History:  Diagnosis Date  . Hernia, abdominal     Past Surgical History:  Procedure Laterality Date  . KNEE SURGERY     left    No family history on file. Social History:  reports that  has never smoked. he has never used smokeless tobacco. He reports that he drinks alcohol. He reports that he does not use drugs.  Allergies: No Known Allergies   (Not in a hospital admission)  Results for orders placed or performed during the hospital encounter of 08/11/17 (from the past 48 hour(s))  Basic metabolic panel     Status: Abnormal   Collection Time: 08/11/17  4:42 PM  Result Value Ref Range   Sodium 140 135 - 145 mmol/L   Potassium 4.0 3.5 - 5.1 mmol/L   Chloride 106 101 - 111 mmol/L   CO2 22 22 - 32 mmol/L   Glucose, Bld 95 65 - 99 mg/dL   BUN 12 6 - 20 mg/dL   Creatinine, Ser 1.29 (H) 0.61 - 1.24 mg/dL   Calcium 9.7 8.9 - 10.3 mg/dL   GFR calc non Af Amer >60 >60 mL/min   GFR calc Af Amer >60 >60 mL/min    Comment: (NOTE) The eGFR has been calculated using the CKD EPI equation. This calculation has not been validated in all clinical situations. eGFR's persistently <60 mL/min signify possible Chronic Kidney Disease.    Anion gap 12 5 - 15    Comment: Performed at [facility] [address] [place], [place] [address]  CBC with Differential     Status: None   Collection Time: 08/11/17  4:42 PM  Result Value Ref Range   WBC 6.9 4.0 - 10.5 K/uL   RBC 5.44 4.22 - 5.81 MIL/uL   Hemoglobin 16.3 13.0 - 17.0 g/dL   HCT 48.8 39.0 - 52.0 %   MCV 89.7 78.0 - 100.0 fL   MCH 30.0 26.0 - 34.0 pg   MCHC 33.4 30.0 - 36.0 g/dL   RDW 14.3 11.5 - 15.5 %   Platelets 285 150 -  400 K/uL   Neutrophils Relative % 77 %   Neutro Abs 5.3 1.7 - 7.7 K/uL   Lymphocytes Relative 19 %   Lymphs Abs 1.3 0.7 - 4.0 K/uL   Monocytes Relative 4 %   Monocytes Absolute 0.3 0.1 - 1.0 K/uL   Eosinophils Relative 0 %   Eosinophils Absolute 0.0 0.0 - 0.7 K/uL   Basophils Relative 0 %   Basophils Absolute 0.0 0.0 - 0.1 K/uL    Comment: Performed at [facility] [address] [place], [place] [address]   No results found.  Review of Systems  Constitutional: Negative for chills and fever.  HENT: Negative for hearing loss.   Eyes: Negative for blurred vision and double vision.  Respiratory: Negative for cough and hemoptysis.   Cardiovascular: Negative for chest pain and palpitations.  Gastrointestinal: Positive for abdominal pain, nausea and vomiting.  Genitourinary: Negative for dysuria and urgency.  Musculoskeletal: Negative for myalgias and neck pain.  Skin: Negative for itching and rash.  Neurological: Negative for dizziness, tingling and headaches.  Endo/Heme/Allergies: Does not bruise/bleed easily.  Psychiatric/Behavioral: Negative for depression and suicidal ideas.    Blood pressure (!) 167/113, pulse 99, temperature 98.2 F (36.8 C), temperature source Oral, resp. rate 19, SpO2 97 %. Physical Exam  Vitals reviewed. Constitutional: He is oriented to person, place, and time. He appears well-developed and well-nourished.  HENT:  Head: Normocephalic and atraumatic.  Eyes: Conjunctivae and EOM are normal. Pupils are equal, round, and reactive to light.  Neck: Normal range of motion. Neck supple.  Cardiovascular: Normal rate and regular rhythm.  Respiratory: Effort normal and breath sounds normal.  GI: Soft. Bowel sounds are normal. He exhibits no distension. There is no tenderness.  Large umbilical hernia firm, unable to reduce, no redness  Musculoskeletal: Normal range of motion.  Neurological: He is alert and oriented to person, place, and time.  Skin: Skin  is warm and dry.  Psychiatric: He has a normal mood and affect. His behavior is normal.     Assessment/Plan 47 yo male with incarcerated large umbilical hernia -OR for open umbilical hernia repair. -we discussed the possibilities of removing intestine in the face of necrotic or gangrenous bowel and we discussed that if there is no necrotic or gangrenous bowel we would plan to insert a mesh to help reduce the risk of recurrence. He showed good understanding   Mickeal Skinner, MD 08/11/2017, 5:50 PM

## 2017-08-11 NOTE — ED Notes (Signed)
Report called to OR RN.  Patient to be taken to short stay 36.  All belongings taken with patient.

## 2017-08-12 ENCOUNTER — Encounter (HOSPITAL_COMMUNITY): Payer: Self-pay | Admitting: Nurse Practitioner

## 2017-08-12 DIAGNOSIS — Z6841 Body Mass Index (BMI) 40.0 and over, adult: Secondary | ICD-10-CM

## 2017-08-12 DIAGNOSIS — I10 Essential (primary) hypertension: Secondary | ICD-10-CM | POA: Diagnosis present

## 2017-08-12 DIAGNOSIS — R739 Hyperglycemia, unspecified: Secondary | ICD-10-CM | POA: Diagnosis present

## 2017-08-12 DIAGNOSIS — Z9989 Dependence on other enabling machines and devices: Secondary | ICD-10-CM

## 2017-08-12 DIAGNOSIS — G4733 Obstructive sleep apnea (adult) (pediatric): Secondary | ICD-10-CM

## 2017-08-12 LAB — CBC
HCT: 43 % (ref 39.0–52.0)
HCT: 47 % (ref 39.0–52.0)
Hemoglobin: 14.1 g/dL (ref 13.0–17.0)
Hemoglobin: 15.7 g/dL (ref 13.0–17.0)
MCH: 29.3 pg (ref 26.0–34.0)
MCH: 30 pg (ref 26.0–34.0)
MCHC: 32.8 g/dL (ref 30.0–36.0)
MCHC: 33.4 g/dL (ref 30.0–36.0)
MCV: 89.4 fL (ref 78.0–100.0)
MCV: 89.9 fL (ref 78.0–100.0)
Platelets: 264 K/uL (ref 150–400)
Platelets: 287 K/uL (ref 150–400)
RBC: 4.81 MIL/uL (ref 4.22–5.81)
RBC: 5.23 MIL/uL (ref 4.22–5.81)
RDW: 14.1 % (ref 11.5–15.5)
RDW: 14.1 % (ref 11.5–15.5)
WBC: 11.6 K/uL — ABNORMAL HIGH (ref 4.0–10.5)
WBC: 12.3 K/uL — ABNORMAL HIGH (ref 4.0–10.5)

## 2017-08-12 LAB — LIPID PANEL
Cholesterol: 200 mg/dL (ref 0–200)
HDL: 35 mg/dL — ABNORMAL LOW (ref >40)
LDL CALC: 148 mg/dL — AB (ref 0–99)
TRIGLYCERIDES: 86 mg/dL (ref <150)
Total CHOL/HDL Ratio: 5.7 RATIO
VLDL: 17 mg/dL (ref 0–40)

## 2017-08-12 LAB — URINALYSIS, ROUTINE W REFLEX MICROSCOPIC
Bilirubin Urine: NEGATIVE
Glucose, UA: NEGATIVE mg/dL
Hgb urine dipstick: NEGATIVE
Ketones, ur: NEGATIVE mg/dL
Leukocytes, UA: NEGATIVE
Nitrite: NEGATIVE
Protein, ur: NEGATIVE mg/dL
Specific Gravity, Urine: 1.024 (ref 1.005–1.030)
pH: 6 (ref 5.0–8.0)

## 2017-08-12 LAB — BASIC METABOLIC PANEL WITH GFR
Anion gap: 11 (ref 5–15)
BUN: 12 mg/dL (ref 6–20)
CO2: 21 mmol/L — ABNORMAL LOW (ref 22–32)
Calcium: 8.7 mg/dL — ABNORMAL LOW (ref 8.9–10.3)
Chloride: 105 mmol/L (ref 101–111)
Creatinine, Ser: 1.22 mg/dL (ref 0.61–1.24)
GFR calc Af Amer: >60 mL/min
GFR calc non Af Amer: >60 mL/min
Glucose, Bld: 131 mg/dL — ABNORMAL HIGH (ref 65–99)
Potassium: 5.4 mmol/L — ABNORMAL HIGH (ref 3.5–5.1)
Sodium: 137 mmol/L (ref 135–145)

## 2017-08-12 LAB — CREATININE, SERUM
Creatinine, Ser: 1.27 mg/dL — ABNORMAL HIGH (ref 0.61–1.24)
GFR calc Af Amer: >60 mL/min
GFR calc non Af Amer: >60 mL/min

## 2017-08-12 LAB — HEMOGLOBIN A1C
HEMOGLOBIN A1C: 6.3 % — AB (ref 4.8–5.6)
Mean Plasma Glucose: 134.11 mg/dL

## 2017-08-12 MED ORDER — LISINOPRIL 10 MG PO TABS
10.0000 mg | ORAL_TABLET | Freq: Every day | ORAL | Status: DC
  Administered 2017-08-12 – 2017-08-14 (×3): 10 mg via ORAL
  Filled 2017-08-12 (×3): qty 1

## 2017-08-12 MED ORDER — DEXTROSE-NACL 5-0.45 % IV SOLN
INTRAVENOUS | Status: DC
  Administered 2017-08-12 – 2017-08-13 (×2): via INTRAVENOUS

## 2017-08-12 MED ORDER — BOOST / RESOURCE BREEZE PO LIQD CUSTOM
1.0000 | Freq: Three times a day (TID) | ORAL | Status: DC
  Administered 2017-08-13 – 2017-08-14 (×2): 1 via ORAL

## 2017-08-12 MED ORDER — ALUM & MAG HYDROXIDE-SIMETH 200-200-20 MG/5ML PO SUSP
30.0000 mL | ORAL | Status: DC | PRN
  Administered 2017-08-12: 30 mL via ORAL
  Filled 2017-08-12: qty 30

## 2017-08-12 NOTE — Consult Note (Addendum)
Consultation note   Christopher Schultz ZOX:[id number] DOB: [birth year] DOA: 08/11/2017   PCP: Patient, No Pcp Per   Attending physician: Melynda Ripple  Requesting physician: [facility]  Reason for consultation: Assist with management of new onset hypertension  HPI: Christopher Schultz is a 47 y.o. male with medical history significant for morbid obesity and sleep apnea on CPAP who was taken to the OR yesterday evening urgently because of an incarcerated umbilical hernia.  He was noted to be hypertensive preoperatively with a blood pressure of 178/113 with hypertension persisting in the postoperative period despite adequate pain control.  We have been requested to evaluate the patient for new diagnosis of hypertension.  Review of Systems:  In addition to the HPI above,  No Fever-chills, myalgias or other constitutional symptoms No Headache, changes with Vision or hearing, new weakness, numbness in any extremity, dizziness, dysarthria or word finding difficulty, gait disturbance or imbalance, tremors or seizure activity.  He reports constant tingling of fingers of left hand for the past 2 weeks.  Denies trauma to arm or neck. No problems swallowing food or Liquids, indigestion/reflux, choking or coughing while eating, abdominal pain with or after eating No Chest pain, Cough or Shortness of Breath, palpitations, orthopnea or DOE No Abdominal pain, N/V, melena,hematochezia, dark tarry stools, constipation No dysuria, malodorous urine, hematuria or flank pain No new skin rashes, lesions, masses or bruises, No new joint pains, aches, swelling or redness No recent unintentional weight gain or loss No polyuria, polydypsia or polyphagia   Past Medical History:  Diagnosis Date  . Hernia, abdominal   . Morbid obesity with BMI of 40.0-44.9, adult (HCC)   . OSA on CPAP     Past Surgical History:  Procedure Laterality Date  . KNEE SURGERY     left  . UMBILICAL HERNIA REPAIR  08/11/2017   Incarcerated hernia    Social History   Socioeconomic History  . Marital status: Single    Spouse name: Not on file  . Number of children: Not on file  . Years of education: Not on file  . Highest education level: Not on file  Social Needs  . Financial resource strain: Not on file  . Food insecurity - worry: Not on file  . Food insecurity - inability: Not on file  . Transportation needs - medical: Not on file  . Transportation needs - non-medical: Not on file  Occupational History  . Not on file  Tobacco Use  . Smoking status: Never Smoker  . Smokeless tobacco: Never Used  Substance and Sexual Activity  . Alcohol use: Yes    Comment: ocassionally  . Drug use: No    Comment: x 1 month  . Sexual activity: Not on file  Other Topics Concern  . Not on file  Social History Narrative  . Not on file    Mobility: Independent Work history: Works as a Biomedical scientist at [employer]   No Known Allergies  Family History  Problem Relation Age of Onset  . Hypertension Mother   . Fibromyalgia Mother     Prior to Admission medications   Medication Sig Start Date End Date Taking? Authorizing Provider  metoCLOPramide (REGLAN) 10 MG tablet Take 1 tablet (10 mg total) by mouth every 6 (six) hours as needed for nausea. Patient not taking: Reported on 08/02/2017 03/03/17   Doug Sou, MD  naproxen sodium (ALEVE) 220 MG tablet Take 440 mg by mouth 2 (two) times daily as needed (pain).  [provider]  phenazopyridine (PYRIDIUM) 200 MG tablet Take 1 tablet (200 mg total) by mouth 3 (three) times daily as needed for pain (bladder spasm). Patient not taking: Reported on 03/03/2017 09/01/15   Trixie DredgeWest, Emily, [place]-C    Physical Exam: Vitals:   08/11/17 2230 08/11/17 2253 08/12/17 0127 08/12/17 0539  BP: (!) 149/99 (!) 140/92 (!) 166/109 (!) 156/101  Pulse: (!) 106 (!) 101 (!) 102 97  Resp: (!) 23 (!) 21 20 (!) 22  Temp: 98 F (36.7 C) 98.9 F (37.2 C) 98.2 F (36.8 C) 98.9 F  (37.2 C)  TempSrc:  Oral Oral Temporal  SpO2: 98% 98% 98% 99%  Weight:  (!) 147.5 kg (325 lb 2.9 oz)    Height:  6' (1.829 m)        Constitutional: NAD, calm, comfortable Eyes: PERRL, lids and conjunctivae normal ENMT: Mucous membranes are moist. Posterior pharynx clear of any exudate or lesions.Normal dentition.  Neck: normal, supple, no masses, no thyromegaly Respiratory: clear to auscultation bilaterally, no wheezing, no crackles. Normal respiratory effort. No accessory muscle use.  Cardiovascular: Regular rate and rhythm, no murmurs / rubs / gallops. No extremity edema. 2+ pedal pulses. No carotid bruits.  Abdomen: Focally tender over umbilical incision site/dressing, no masses palpated. No hepatosplenomegaly. Bowel sounds positive.  Musculoskeletal: no clubbing / cyanosis. No joint deformity upper and lower extremities. Good ROM, no contractures. Normal muscle tone.  Skin: no rashes, lesions, ulcers. No induration Neurologic: CN 2-12 grossly intact. Sensation intact, DTR normal. Strength 5/5 x all 4 extremities.  Psychiatric: Normal judgment and insight. Alert and oriented x 3. Normal mood.    Labs on Admission: I have personally reviewed following labs and imaging studies  CBC: Recent Labs  Lab 08/11/17 1642 08/11/17 2341  WBC 6.9 11.6*  NEUTROABS 5.3  --   HGB 16.3 15.7  HCT 48.8 47.0  MCV 89.7 89.9  PLT 285 287   Basic Metabolic Panel: Recent Labs  Lab 08/11/17 1642 08/11/17 2341 08/12/17 0706  NA 140  --  137  K 4.0  --  5.4*  CL 106  --  105  CO2 22  --  21*  GLUCOSE 95  --  131*  BUN 12  --  12  CREATININE 1.29* 1.27* 1.22  CALCIUM 9.7  --  8.7*   GFR: Estimated Creatinine Clearance: 114.2 mL/min (by C-G formula based on SCr of 1.22 mg/dL). Liver Function Tests: No results for input(s): AST, ALT, ALKPHOS, BILITOT, PROT, ALBUMIN in the last 168 hours. No results for input(s): LIPASE, AMYLASE in the last 168 hours. No results for input(s): AMMONIA in  the last 168 hours. Coagulation Profile: No results for input(s): INR, PROTIME in the last 168 hours. Cardiac Enzymes: No results for input(s): CKTOTAL, CKMB, CKMBINDEX, TROPONINI in the last 168 hours. BNP (last 3 results) No results for input(s): PROBNP in the last 8760 hours. HbA1C: No results for input(s): HGBA1C in the last 72 hours. CBG: No results for input(s): GLUCAP in the last 168 hours. Lipid Profile: No results for input(s): CHOL, HDL, LDLCALC, TRIG, CHOLHDL, LDLDIRECT in the last 72 hours. Thyroid Function Tests: No results for input(s): TSH, T4TOTAL, FREET4, T3FREE, THYROIDAB in the last 72 hours. Anemia Panel: No results for input(s): VITAMINB12, FOLATE, FERRITIN, TIBC, IRON, RETICCTPCT in the last 72 hours. Urine analysis:    Component Value Date/Time   COLORURINE YELLOW 03/03/2017 1507   APPEARANCEUR HAZY (A) 03/03/2017 1507   LABSPEC 1.026 03/03/2017 1507  PHURINE 5.0 03/03/2017 1507   GLUCOSEU NEGATIVE 03/03/2017 1507   HGBUR NEGATIVE 03/03/2017 1507   BILIRUBINUR NEGATIVE 03/03/2017 1507   KETONESUR NEGATIVE 03/03/2017 1507   PROTEINUR NEGATIVE 03/03/2017 1507   UROBILINOGEN 0.2 10/05/2012 1108   NITRITE NEGATIVE 03/03/2017 1507   LEUKOCYTESUR TRACE (A) 03/03/2017 1507   Sepsis Labs: @LABRCNTIP (procalcitonin:4,lacticidven:4) )No results found for this or any previous visit (from the past 240 hour(s)).   Radiological Exams on Admission: No results found.   Assessment/Plan Principal Problem:   Hypertension -Patient presents secondary to incarcerated umbilical hernia and has had persistent hypertension since arrival despite adequate pain control -Patient reports that several weeks ago at an ER visit for an unrelated complaint he was told he had "borderline hypertension. -Positive family history of hypertension/mother -Begin lisinopril 10 mg daily-avoid rapid overcorrection of blood pressure; avoid beta-blockers and young male patients to minimize risk  of ED -Baseline EKG and echocardiogram given underlying sleep apnea and likelihood of cardiac remodeling -Renal function normal but will obtain urinalysis to check for microscopic proteinuria -Discussed diet management and modification of salt intake with patient -Patient will need to establish with PCP for follow-up of hypertension after discharge-reports he is eligible for his new insurance beginning on Monday -Cardiovascular screening with lipid panel: suboptimal HDL and LDL > 100 so comsider the addition of statin  Active Problems:   Morbid obesity with BMI of 40.0-44.9, adult  -Weight reduction strategies discussed with patient with diet modification as primary modality for weight loss with eventual adjunctive physical exercise once cleared by surgery    Acute hyperglycemia -Obtain Hemoglobin A1c (6.3 c/w pre diabetes)- resulted after my interaction w/ the patient     OSA on CPAP -Continue as per home use regimen    Tingling fingers left hand -Denies remote or recent trauma -If symptoms persist consider C-spine CT or MRI    Umbilical hernia with obstruction (incarceration)-s/p umbilical hernia repair -Management per surgical team    **Additional lab, imaging and/or diagnostic evaluation at discretion of supervising physician  DVT prophylaxis: Lovenox Code Status: Full code Family Communication: No family at bedside Disposition Plan: Home at discretion of primary team     , L. ANP-BC [facility] Pager [phone]   If 7PM-7AM, please contact night-coverage [email or web address] Password [email or web address]  08/12/2017, 9:00 AM

## 2017-08-12 NOTE — Progress Notes (Signed)
Patient ID: Christopher Schultz, male   DOB: [birth year], 47 y.o.   MRN: [id number]   Acute Care Surgery Service Progress Note:    Chief Complaint/Subjective: "Sore" No n/v. Hasn't walked Used IS Ongoing HTN  Objective: Vital signs in last 24 hours: Temp:  [97.5 F (36.4 C)-98.9 F (37.2 C)] 98.9 F (37.2 C) (03/16 0539) Pulse Rate:  [92-109] 97 (03/16 0539) Resp:  [15-30] 22 (03/16 0539) BP: (140-178)/(92-114) 156/101 (03/16 0539) SpO2:  [89 %-99 %] 99 % (03/16 0539) Weight:  [147.5 kg (325 lb 2.9 oz)] 147.5 kg (325 lb 2.9 oz) (03/15 2253) Last BM Date: 08/11/17  Intake/Output from previous day: 03/15 0701 - 03/16 0700 In: 3007.5 [I.V.:3007.5] Out: 725 [Urine:675; Blood:50] Intake/Output this shift: No intake/output data recorded.  Lungs: cta, nonlabored  Cardiovascular: reg  Abd: obese, soft, dressing c/d/i; +binder  Extremities: no edema, +SCDs  Neuro: alert, nonfocal  Lab Results: CBC  Recent Labs    08/11/17 1642 08/11/17 2341  WBC 6.9 11.6*  HGB 16.3 15.7  HCT 48.8 47.0  PLT 285 287   BMET Recent Labs    08/11/17 1642 08/11/17 2341  NA 140  --   K 4.0  --   CL 106  --   CO2 22  --   GLUCOSE 95  --   BUN 12  --   CREATININE 1.29* 1.27*  CALCIUM 9.7  --    LFT Hepatic Function Latest Ref Rng & Units 03/03/2017 10/03/2013 04/10/2013  Total Protein 6.5 - 8.1 g/dL 7.4 7.6 7.4  Albumin 3.5 - 5.0 g/dL 4.0 4.1 3.9  AST 15 - 41 U/L 16 21 23   ALT 17 - 63 U/L 22 34 36  Alk Phosphatase 38 - 126 U/L 71 68 68  Total Bilirubin 0.3 - 1.2 mg/dL 0.4 0.3 0.1(L)   PT/INR No results for input(s): LABPROT, INR in the last 72 hours. ABG No results for input(s): PHART, HCO3 in the last 72 hours.  Invalid input(s): PCO2, PO2  Studies/Results:  Anti-infectives: Anti-infectives (From admission, onward)   Start     Dose/Rate Route Frequency Ordered Stop   08/12/17 0600  cefoTEtan (CEFOTAN) 2 g in sodium chloride 0.9 % 100 mL IVPB     2 g 200 mL/hr over 30  Minutes Intravenous On call to O.R. 08/11/17 1809 08/11/17 1927   08/11/17 1824  cefoTEtan in Dextrose 5% (CEFOTAN) 2-2.08 GM-%(50ML) IVPB    Comments:  Lisette Grinder   : cabinet override      08/11/17 1824 08/12/17 0629      Medications: Scheduled Meds: . acetaminophen  1,000 mg Oral Q6H  . enoxaparin (LOVENOX) injection  40 mg Subcutaneous Daily  . feeding supplement  1 Container Oral TID BM  . gabapentin  300 mg Oral BID  . hydrALAZINE      . ketorolac  30 mg Intravenous Q6H  . labetalol      . metoprolol tartrate       Continuous Infusions: . dextrose 5 % and 0.45 % NaCl with KCl 20 mEq/L 75 mL/hr at 08/11/17 2140  . lactated ringers 10 mL/hr at 08/11/17 1822   PRN Meds:.diphenhydrAMINE **OR** diphenhydrAMINE, hydrALAZINE, ketorolac **FOLLOWED BY** [START ON 08/17/2017] ketorolac, metoprolol tartrate, morphine injection, ondansetron **OR** ondansetron (ZOFRAN) IV, oxyCODONE, polyethylene glycol, traMADol  Assessment/Plan: Patient Active Problem List   Diagnosis Date Noted  . Hernia, umbilical 02/72/5366  . Umbilical hernia with obstruction 08/11/2017   s/p Procedure(s): Incarcerated umbilical hernia s/p HERNIA REPAIR UMBILICAL  ADULT OPEN 08/11/2017 Dr Kieth Brightly Severe obesity OSA on cpap  Clear liquid for now Cont scheduled enhanced recovery meds  Ambulate, pulm toilet Chemical vte prophylaxis Elevated Blood pressure - appears pt has undiagnosed HTN. Will consult [facility] for management.   Discussed intraop findings with pt.   Disposition:  LOS: 1 day    Leighton Ruff. Redmond Pulling, MD, FACS General, Bariatric, & Minimally Invasive Surgery [phone] [facility] so

## 2017-08-12 NOTE — Plan of Care (Signed)
  Progressing Clinical Measurements: Ability to maintain clinical measurements within normal limits will improve 08/12/2017 1510 - Progressing by Montez Hagemanalwitan, Clorinda Wyble P, RN Activity: Risk for activity intolerance will decrease 08/12/2017 1510 - Progressing by Montez Hagemanalwitan, Jalesa Thien P, RN Pain Managment: General experience of comfort will improve 08/12/2017 1510 - Progressing by Montez Hagemanalwitan, Isaiah Torok P, RN

## 2017-08-12 NOTE — Progress Notes (Signed)
Baseline EKG completed.  Sinus rhythm with slightly tachycardic rate 100 bpm with QTC 466 ms, normal R wave rotation with voltage criteria met for LVH, no acute ischemic changes.  Follow-up on echocardiogram ordered  Erin Hearing, ANP

## 2017-08-12 NOTE — Plan of Care (Signed)
Nutritional Education Note  RD consulted for nutrition education regarding weight loss. He has new dx of HTN and prediabetes  Body mass index is 44.1 kg/m. Pt meets criteria for Morbidly Obese based on current BMI.  Spoke with patient and fiance, who entered approximately 5-10 minutes after start.   Attempted to take diet recall, but Patient reports an extreme amount of variance in his diet.  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 He says that much of his intake during the day is from the sampling of the customer meals. He says he does not eat in the morning before work and sometimes he will go all day w/o eating, not eating until he returns from work. He has a very disorganized eating pattern.  He does note that his weakness is potato chips. He did not mention exact quantity, but insinuated that it was a extraordinarily large amount. He says he could live only on potato chips and easily consumes >1 large bag/day.   He also endorses drinking soda, juice and beer. He estimates he drinks 3 sodas a day, but says he favors juice more. He drinks water while at work.  ________________________________________________________________________  First and foremost, he is over-consuming sugar sweetened beverages (SSB). He is drinking both soda and juice on, what sounds to be, a daily basis. RD reviewed how SSBs are the number one cause of obesity and diabetes. They offer minimal nutrition and promote quick fat storage. It is in his best interest to eliminate these from his diet. Gave goal to only consume 1 SSB/day  Secondly, we discussed how his disordered eating pattern can cause highs/lows in blood sugar and his prolonged fasting only promotes fat storage, not loss. Emphasized need to eat every few hours, prioritizing breakfast. We reviewed appropriate breakfast options  We then reviewed the plate method. Educated on how his meals should be comprised of 25% carb, 25-50% protein and 25-50% veg. This is the  ideal food group ratio for both glycemic control and weight loss. Discussed how fiber and protein work to slow glucose absorption and how high fiber/protein intake can lead to weight loss.   Regarding his potato chips, he is well aware this is a habit that needs to be changed. We discussed more appropriate options. Ideally, he would consume some fiber/protein containing foods for snacks, but for now, fruit would be a better choice.   Emphasized the importance of physical activity and how this can not only promote weight loss, but also improve glycemic control. He says he actually had made an appointment with a trainer scheduled tomorrow before he ended up in the hospital. He is planning on rescheduling this.   He eats white grains mostly, but prefers whole wheat. RD recommended he only consume whole grains.   Summary of Recommendations: 1. Limit SSB to 1/day 2. DONT SKIP MEALS. Eat atleast 3x a day 3. Follow the plate method. Make meals 25% carb, 25-50% protein and 25-50% veg. 4. Switch to whole grains 5. Start physical Activity.  6. Reduce consumption of potato chips to 1 small bag/week  Expect GOOD compliance. Both he and his fiance seem to be motivated to make changes to lifestyle. His fiance is actually a health ed/PE teacher and has been teaching her students the same thing. She will be a good influence on patient  No further nutrition interventions warranted at this time. If additional nutrition issues arise, please re-consult RD.  Christopher LouisNathan Schultz RD, LDN, CNSC Clinical Nutrition Pager: [phone] 08/12/2017 3:30 PM

## 2017-08-12 NOTE — Progress Notes (Signed)
RT placed patient on CPAP. Patient resting comfortably at this time. SPO2 100%. RT will monitor as needed.

## 2017-08-13 ENCOUNTER — Other Ambulatory Visit (HOSPITAL_COMMUNITY): Payer: Self-pay

## 2017-08-13 ENCOUNTER — Inpatient Hospital Stay (HOSPITAL_COMMUNITY): Payer: Self-pay

## 2017-08-13 DIAGNOSIS — I351 Nonrheumatic aortic (valve) insufficiency: Secondary | ICD-10-CM

## 2017-08-13 LAB — CBC
HEMATOCRIT: 44 % (ref 39.0–52.0)
HEMOGLOBIN: 14.2 g/dL (ref 13.0–17.0)
MCH: 29.3 pg (ref 26.0–34.0)
MCHC: 32.3 g/dL (ref 30.0–36.0)
MCV: 90.7 fL (ref 78.0–100.0)
Platelets: 258 10*3/uL (ref 150–400)
RBC: 4.85 MIL/uL (ref 4.22–5.81)
RDW: 14.1 % (ref 11.5–15.5)
WBC: 8.4 10*3/uL (ref 4.0–10.5)

## 2017-08-13 LAB — BASIC METABOLIC PANEL
ANION GAP: 8 (ref 5–15)
BUN: 8 mg/dL (ref 6–20)
CHLORIDE: 103 mmol/L (ref 101–111)
CO2: 24 mmol/L (ref 22–32)
Calcium: 8.5 mg/dL — ABNORMAL LOW (ref 8.9–10.3)
Creatinine, Ser: 1.18 mg/dL (ref 0.61–1.24)
GFR calc Af Amer: >60 mL/min (ref >60)
GFR calc non Af Amer: >60 mL/min (ref >60)
Glucose, Bld: 249 mg/dL — ABNORMAL HIGH (ref 65–99)
POTASSIUM: 4 mmol/L (ref 3.5–5.1)
Sodium: 135 mmol/L (ref 135–145)

## 2017-08-13 LAB — ECHOCARDIOGRAM COMPLETE
Height: 72 in
WEIGHTICAEL: 5202.86 [oz_av]

## 2017-08-13 MED ORDER — DOCUSATE SODIUM 100 MG PO CAPS
100.0000 mg | ORAL_CAPSULE | Freq: Two times a day (BID) | ORAL | Status: DC
  Administered 2017-08-13 – 2017-08-14 (×3): 100 mg via ORAL
  Filled 2017-08-13 (×3): qty 1

## 2017-08-13 MED ORDER — METFORMIN HCL ER 500 MG PO TB24
500.0000 mg | ORAL_TABLET | Freq: Every day | ORAL | Status: DC
  Administered 2017-08-14: 500 mg via ORAL
  Filled 2017-08-13: qty 1

## 2017-08-13 MED ORDER — CLONIDINE HCL 0.1 MG PO TABS
0.1000 mg | ORAL_TABLET | Freq: Two times a day (BID) | ORAL | Status: DC
  Administered 2017-08-13 – 2017-08-14 (×3): 0.1 mg via ORAL
  Filled 2017-08-13 (×3): qty 1

## 2017-08-13 MED ORDER — ATORVASTATIN CALCIUM 40 MG PO TABS
40.0000 mg | ORAL_TABLET | Freq: Every day | ORAL | Status: DC
  Administered 2017-08-13: 40 mg via ORAL
  Filled 2017-08-13: qty 1

## 2017-08-13 NOTE — Progress Notes (Signed)
Echocardiogram 2D Echocardiogram has been performed.  Christopher Schultz, Christopher Schultz 08/13/2017, 2:41 PM

## 2017-08-13 NOTE — Progress Notes (Signed)
Patient ID: Christopher Schultz, male   DOB: 06-13-1970, 47 y.o.   MRN: [id number]   Acute Care Surgery Service Progress Note:    Chief Complaint/Subjective: No n/v. No burping A little flatus States clears went well C/o abd pain when up moving around Had echo  Objective: Vital signs in last 24 hours: Temp:  [98.4 F (36.9 C)-98.9 F (37.2 C)] 98.8 F (37.1 C) (03/17 0532) Pulse Rate:  [92-102] 92 (03/17 0532) Resp:  [18-21] 18 (03/17 0532) BP: (150-172)/(96-103) 151/103 (03/17 0532) SpO2:  [95 %-100 %] 95 % (03/17 0532) Last BM Date: 08/11/17  Intake/Output from previous day: 03/16 0701 - 03/17 0700 In: 1452.5 [P.O.:460; I.V.:992.5] Out: 1000 [Urine:1000] Intake/Output this shift: No intake/output data recorded.  Lungs: cta, nonlabored  Cardiovascular: reg  Abd: obese, soft, +binder  Extremities: no edema, +SCDs  Neuro: alert, nonfocal  Lab Results: CBC  Recent Labs    08/11/17 2341 08/12/17 0845  WBC 11.6* 12.3*  HGB 15.7 14.1  HCT 47.0 43.0  PLT 287 264   BMET Recent Labs    08/11/17 1642 08/11/17 2341 08/12/17 0706  NA 140  --  137  K 4.0  --  5.4*  CL 106  --  105  CO2 22  --  21*  GLUCOSE 95  --  131*  BUN 12  --  12  CREATININE 1.29* 1.27* 1.22  CALCIUM 9.7  --  8.7*   LFT Hepatic Function Latest Ref Rng & Units 03/03/2017 10/03/2013 04/10/2013  Total Protein 6.5 - 8.1 g/dL 7.4 7.6 7.4  Albumin 3.5 - 5.0 g/dL 4.0 4.1 3.9  AST 15 - 41 U/L _0 ALT 17 - 63 U/L 22 34 36  Alk Phosphatase 38 - 126 U/L 71 68 68  Total Bilirubin 0.3 - 1.2 mg/dL 0.4 0.3 0.1(L)   PT/INR No results for input(s): LABPROT, INR in the last 72 hours. ABG No results for input(s): PHART, HCO3 in the last 72 hours.  Invalid input(s): PCO2, PO2  Studies/Results:  Anti-infectives: Anti-infectives (From admission, onward)   Start     Dose/Rate Route Frequency Ordered Stop   08/12/17 0600  cefoTEtan (CEFOTAN) 2 g in sodium chloride 0.9 % 100 mL IVPB     2 g 200  mL/hr over 30 Minutes Intravenous On call to O.R. 08/11/17 1809 08/11/17 1927   08/11/17 1824  cefoTEtan in Dextrose 5% (CEFOTAN) 2-2.08 GM-%(50ML) IVPB    Comments:  Lisette Grinder   : cabinet override      08/11/17 1824 08/12/17 0629      Medications: Scheduled Meds: . acetaminophen  1,000 mg Oral Q6H  . enoxaparin (LOVENOX) injection  40 mg Subcutaneous Daily  . feeding supplement  1 Container Oral TID BM  . gabapentin  300 mg Oral BID  . ketorolac  30 mg Intravenous Q6H  . lisinopril  10 mg Oral Daily   Continuous Infusions: . dextrose 5 % and 0.45% NaCl 75 mL/hr at 08/13/17 0017  . lactated ringers 10 mL/hr at 08/11/17 1822   PRN Meds:.alum & mag hydroxide-simeth, diphenhydrAMINE **OR** diphenhydrAMINE, ketorolac **FOLLOWED BY** [START ON 08/17/2017] ketorolac, morphine injection, ondansetron **OR** ondansetron (ZOFRAN) IV, oxyCODONE, polyethylene glycol, traMADol  Assessment/Plan: Patient Active Problem List   Diagnosis Date Noted  . Hypertension 08/12/2017  . Morbid obesity with BMI of 40.0-44.9, adult (Agency) 08/12/2017  . Acute hyperglycemia 08/12/2017  . OSA on CPAP 08/12/2017  . Hernia, umbilical 37/62/8315  . Umbilical hernia with obstruction 08/11/2017  s/p Procedure(s): Incarcerated umbilical hernia s/p HERNIA REPAIR UMBILICAL ADULT OPEN 4/65/0354 Dr Kieth Brightly Severe obesity OSA on cpap HTN- mgmt per [facility], just started on lisinopril yesterday, echo results pending Prediabetes - found on lab yesterday  Hyperkalemia - K 5.4 yesterday, repeat labs pending  Adv diet to cardiac fit KVO ivf Cont scheduled enhanced recovery meds  Ambulate, pulm toilet Chemical vte prophylaxis  Poss dc Monday Awaiting return of bowel function   Disposition:  LOS: 2 days    Leighton Ruff. Redmond Pulling, MD, FACS General, Bariatric, & Minimally Invasive Surgery [phone] [facility]

## 2017-08-13 NOTE — Progress Notes (Signed)
Hospitalist consult note   Christopher Schultz  ZOX:[id number] DOB: 03/15/1971 DOA: 08/11/2017 PCP: Patient, No Pcp Per Specialists:    Brief Narrative:  47 year old male Morbid obesity sleep apnea on CPAP Hypertensive Admitted with incarcerated umbilical hernia status post surgery 3/15 and consulted by general surgery regarding hypertension and other medical issues  Assessment & Plan:   Assessment:  The encounter diagnosis was Incarcerated umbilical hernia.  Hypertension-lisinopril started, echo pending, usually lisinopril will take about a week to start working-in the interim if able to take p.o. will start clonidine 0.1 twice daily for overall control.  This can be weaned as an outpatient if needed Echocardiogram is pending  Hyperkalemia-I do not know if this is secondary to use of lisinopril.  If repeat labs show an increase he will need to use an alternate agent  A1c 6.3 sugars 95-130-he has prediabetes-I would recommend potentially using low-dose metformin as an outpatient if he is unable to lose weight as this has a good effect on weight loss as well.  I will discussed this with him and have him follow-up with primary care physician  Hyperlipidemia LDL 148-LDL is 35 based on Framingham risk score is risk of developing see she is 12 %/yearand is hypertensive-he would benefit from statin and we will start this prior to discharge  OSA on CPAP-am not sure if he knows the settings--he hasnt been compliant as OP  Body mass index is 44.1 kg/m.  I have encouraged him to think about possible gastric outlet procedure as this may help him lose weight and he can follow-up with general surgery if you have.  He thinks he wants to use this   No PCP-will need DME on d/c cpap and meds as well as PCP OP follow -up--Asking CM to help   DVT prophylaxis: loveneox  Code Status:   full   Family Communication:    wife Disposition Plan:  ip for now   Consultants:   We are consulting on patient  e  Procedures:  laparoscopic procedur  Antimicrobials:   Perioperative  Subjective: Awake alert no distress tolerating some clears pain is moderate Wife is in the room Patient not in distress  Objective: Vitals:   08/12/17 1600 08/12/17 2028 08/12/17 2244 08/13/17 0532  BP: (!) 150/100 (!) 157/99  (!) 151/103  Pulse:  97 97 92  Resp:  20 18 18   Temp:  98.4 F (36.9 C)  98.8 F (37.1 C)  TempSrc:  Oral  Oral  SpO2:  100% 100% 95%  Weight:      Height:        Intake/Output Summary (Last 24 hours) at 08/13/2017 0851 Last data filed at 08/13/2017 0534 Gross per 24 hour  Intake 1452.5 ml  Output 1000 ml  Net 452.5 ml   Filed Weights   08/11/17 2253  Weight: (!) 147.5 kg (325 lb 2.9 oz)    Examination: Awake obese pleasant alert and oriented no distress S1-S2 no murmur Has abdominal binder in place No lower extremity edema No rales no rhonchi Mallampati 4 Neurologically intact skin skeletal within normal limits   Data Reviewed: I have personally reviewed following labs and imaging studies  CBC: Recent Labs  Lab 08/11/17 1642 08/11/17 2341 08/12/17 0845 08/13/17 0758  WBC 6.9 11.6* 12.3* 8.4  NEUTROABS 5.3  --   --   --   HGB 16.3 15.7 14.1 14.2  HCT 48.8 47.0 43.0 44.0  MCV 89.7 89.9 89.4 90.7  PLT 285 287 264 258  Basic Metabolic Panel: Recent Labs  Lab 08/11/17 1642 08/11/17 2341 08/12/17 0706  NA 140  --  137  K 4.0  --  5.4*  CL 106  --  105  CO2 22  --  21*  GLUCOSE 95  --  131*  BUN 12  --  12  CREATININE 1.29* 1.27* 1.22  CALCIUM 9.7  --  8.7*   GFR: Estimated Creatinine Clearance: 114.2 mL/min (by C-G formula based on SCr of 1.22 mg/dL). Liver Function Tests: No results for input(s): AST, ALT, ALKPHOS, BILITOT, PROT, ALBUMIN in the last 168 hours. No results for input(s): LIPASE, AMYLASE in the last 168 hours. No results for input(s): AMMONIA in the last 168 hours. Coagulation Profile: No results for input(s): INR, PROTIME in  the last 168 hours. Cardiac Enzymes: No results for input(s): CKTOTAL, CKMB, CKMBINDEX, TROPONINI in the last 168 hours. CBG: No results for input(s): GLUCAP in the last 168 hours. Urine analysis:    Component Value Date/Time   COLORURINE YELLOW 08/12/2017 2315   APPEARANCEUR CLEAR 08/12/2017 2315   LABSPEC 1.024 08/12/2017 2315   PHURINE 6.0 08/12/2017 2315   GLUCOSEU NEGATIVE 08/12/2017 2315   HGBUR NEGATIVE 08/12/2017 2315   BILIRUBINUR NEGATIVE 08/12/2017 2315   KETONESUR NEGATIVE 08/12/2017 2315   PROTEINUR NEGATIVE 08/12/2017 2315   UROBILINOGEN 0.2 10/05/2012 1108   NITRITE NEGATIVE 08/12/2017 2315   LEUKOCYTESUR NEGATIVE 08/12/2017 2315     Radiology Studies: Reviewed images personally in health database    Scheduled Meds: . acetaminophen  1,000 mg Oral Q6H  . docusate sodium  100 mg Oral BID  . enoxaparin (LOVENOX) injection  40 mg Subcutaneous Daily  . feeding supplement  1 Container Oral TID BM  . gabapentin  300 mg Oral BID  . ketorolac  30 mg Intravenous Q6H  . lisinopril  10 mg Oral Daily   Continuous Infusions: . dextrose 5 % and 0.45% NaCl 75 mL/hr at 08/13/17 0017  . lactated ringers 10 mL/hr at 08/11/17 1822     LOS: 2 days    Time spent: 52    Pleas Koch, MD [facility] ([facility]   If 7PM-7AM, please contact night-coverage [email or web address] Password [facility] 08/13/2017, 8:51 AM

## 2017-08-13 NOTE — Care Management (Signed)
Pt reports he has not had a sleep study in >5 years.  Pt will require a sleep study as an outpatient prior to receiving a CPAP.

## 2017-08-14 ENCOUNTER — Encounter (HOSPITAL_COMMUNITY): Payer: Self-pay | Admitting: General Practice

## 2017-08-14 ENCOUNTER — Other Ambulatory Visit: Payer: Self-pay

## 2017-08-14 DIAGNOSIS — I10 Essential (primary) hypertension: Secondary | ICD-10-CM

## 2017-08-14 LAB — RENAL FUNCTION PANEL
ALBUMIN: 3.1 g/dL — AB (ref 3.5–5.0)
Anion gap: 8 (ref 5–15)
BUN: 12 mg/dL (ref 6–20)
CO2: 25 mmol/L (ref 22–32)
CREATININE: 1.24 mg/dL (ref 0.61–1.24)
Calcium: 8.8 mg/dL — ABNORMAL LOW (ref 8.9–10.3)
Chloride: 105 mmol/L (ref 101–111)
Glucose, Bld: 102 mg/dL — ABNORMAL HIGH (ref 65–99)
PHOSPHORUS: 3.7 mg/dL (ref 2.5–4.6)
Potassium: 4 mmol/L (ref 3.5–5.1)
SODIUM: 138 mmol/L (ref 135–145)

## 2017-08-14 MED ORDER — OXYCODONE HCL 5 MG PO TABS: 5.0000 mg | ORAL_TABLET | ORAL | 0 refills | Status: DC | PRN

## 2017-08-14 MED ORDER — METFORMIN HCL ER 500 MG PO TB24: 500.0000 mg | ORAL_TABLET | Freq: Every day | ORAL | 0 refills | Status: DC

## 2017-08-14 MED ORDER — CLONIDINE HCL 0.1 MG PO TABS: 0.1000 mg | ORAL_TABLET | Freq: Two times a day (BID) | ORAL | 0 refills | Status: DC

## 2017-08-14 MED ORDER — LISINOPRIL 10 MG PO TABS: 10.0000 mg | ORAL_TABLET | Freq: Every day | ORAL | 0 refills | Status: DC

## 2017-08-14 MED ORDER — BLOOD PRESSURE CONTROL BOOK
Freq: Once | Status: AC
  Administered 2017-08-14: 13:00:00
  Filled 2017-08-14: qty 1

## 2017-08-14 MED ORDER — ATORVASTATIN CALCIUM 40 MG PO TABS: 40.0000 mg | ORAL_TABLET | Freq: Every day | ORAL | 0 refills | Status: DC

## 2017-08-14 NOTE — Care Management Note (Addendum)
Case Management Note  Patient Details  Name: Christopher PuntDavid A Schultz MRN: [id number] Date of Birth: 12/17/1970  Subjective/Objective:                    Action/Plan:  Consult for PCP.  [facility] not taking new patients at present , may start April 1.  [facility] not taking new patients at present. [facility] ,left message.  [facility] scheduled appointment for August 30, 2017 at 4 pm.Patient has appointment  Contact  Information. Voived understanding.  Provided and explained  MATCH letter , medicine assistance letter and voiced understanding.   Patient has not had sleep study in over 5 years. Before he can get a home CPAP he needs a sleep study. Before he can schedule a sleep study he needs an appointment with a pulmologist .   Discussed with patient , [facility] , called made appointment with Dr Craige CottaSood on September 26, 2017 at 1030.  Patient aware of all of above and voiced understanding Expected Discharge Date:                  Expected Discharge Plan:  Home/Self Care  In-House Referral:     Discharge planning Services  CM Consult, [facility], [facility], Medication Assistance  Post Acute Care Choice:  Durable Medical Equipment Choice offered to:  Patient  DME Arranged:  N/A DME Agency:  NA  HH Arranged:  NA HH Agency:  NA  Status of Service:  In process, will continue to follow  If discussed at Long Length of Stay Meetings, dates discussed:    Additional Comments:  Kingsley PlanWile, Maurisio Ruddy Marie, RN 08/14/2017, 10:59 AM

## 2017-08-14 NOTE — Progress Notes (Signed)
[facility] Progress Note  3 Days Post-Op   Assessment/Plan    Hypertension - per medicine  - ECHO showed mild diastolic dysfunction   Hernia, umbilical   Umbilical hernia with obstruction   Morbid obesity with BMI of 40.0-44.9, adult (HCC)   Acute hyperglycemia - per medicine   OSA on CPAP-  Needs sleep study before pt can get a home CPAP   Incarcerated Umbilical hernia - s/pHERNIA REPAIR UMBILICAL ADULT OPEN 3/15/2019Dr Kinsinger  FEN: heart healthy VTE: SCD's, lovenox ID: [email or web address] 03/15-03/16 Foley: none Follow up: Dr. Sheliah Hatch   DISPO: okay for discharge from a surgical standpoint. BP still elevated, lisinopril started 03/16. Will await medicine recommendations for BP. Pt states he is working on getting a PCP. Appreciate medicine assistance.     LOS: 3 days    Subjective: CC: s/p umbilical hernia repair  Pain well controlled. Tolerating diet. Having flatus. No BM. No nausea or vomiting. Has been ambulating. Pt working on getting PCP. I discussed the results of the ECHO with pt. No family at bedside.   Objective: Vital signs in last 24 hours: Temp:  [97.5 F (36.4 C)-99.3 F (37.4 C)] 97.5 F (36.4 C) (03/18 0506) Pulse Rate:  [83-102] 83 (03/18 0506) Resp:  [18] 18 (03/18 0506) BP: (155-159)/(93-104) 157/93 (03/18 0506) SpO2:  [96 %-100 %] 100 % (03/18 0506) Last BM Date: 08/11/17  Intake/Output from previous day: 03/17 0701 - 03/18 0700 In: 280 [P.O.:220; I.V.:60] Out: -  Intake/Output this shift: No intake/output data recorded.  PE: Gen:  Alert, NAD, pleasant, cooperative Card:  RRR, no M/G/R heard Pulm:  CTA, no W/R/R, effort normal Abd: Soft, obese, not distended, good BS, mild generalized TTP without guarding. No peritonitis.  Skin: no rashes noted, warm and dry   Anti-infectives: Anti-infectives (From admission, onward)   Start     Dose/Rate Route Frequency Ordered Stop   08/12/17 0600  cefoTEtan (CEFOTAN) 2 g in  sodium chloride 0.9 % 100 mL IVPB     2 g 200 mL/hr over 30 Minutes Intravenous On call to O.R. 08/11/17 1809 08/11/17 1927   08/11/17 1824  cefoTEtan in Dextrose 5% (CEFOTAN) 2-2.08 GM-%(50ML) IVPB    Comments:  Sabino Niemann   : cabinet override      08/11/17 1824 08/12/17 0629      Lab Results:  Recent Labs    08/12/17 0845 08/13/17 0758  WBC 12.3* 8.4  HGB 14.1 14.2  HCT 43.0 44.0  PLT 264 258   BMET Recent Labs    08/13/17 0758 08/14/17 0402  NA 135 138  K 4.0 4.0  CL 103 105  CO2 24 25  GLUCOSE 249* 102*  BUN 8 12  CREATININE 1.18 1.24  CALCIUM 8.5* 8.8*   PT/INR No results for input(s): LABPROT, INR in the last 72 hours. CMP     Component Value Date/Time   NA 138 08/14/2017 0402   K 4.0 08/14/2017 0402   CL 105 08/14/2017 0402   CO2 25 08/14/2017 0402   GLUCOSE 102 (H) 08/14/2017 0402   BUN 12 08/14/2017 0402   CREATININE 1.24 08/14/2017 0402   CALCIUM 8.8 (L) 08/14/2017 0402   PROT 7.4 03/03/2017 1501   ALBUMIN 3.1 (L) 08/14/2017 0402   AST 16 03/03/2017 1501   ALT 22 03/03/2017 1501   ALKPHOS 71 03/03/2017 1501   BILITOT 0.4 03/03/2017 1501   GFRNONAA >60 08/14/2017 0402   GFRAA >60 08/14/2017 0402   Lipase  Component Value Date/Time   LIPASE 35 03/03/2017 1501    Studies/Results: No results found.    Jerre SimonJessica L Ebelyn Bohnet , [facility] 08/14/2017, 8:58 AM  Pager: [phone] Mon-Wed, Friday 7:00am-4:30pm Thurs 7am-11:30am  Consults: [phone]

## 2017-08-14 NOTE — Discharge Instructions (Signed)
[facility] _______[facility] ° °UMBILICAL OR INGUINAL HERNIA REPAIR: POST OP INSTRUCTIONS ° °Always review your discharge instruction sheet given to you by the facility where your surgery was performed. °IF YOU HAVE DISABILITY OR FAMILY LEAVE FORMS, YOU MUST BRING THEM TO THE OFFICE FOR PROCESSING.   °DO NOT GIVE THEM TO YOUR DOCTOR. ° °1. A  prescription for pain medication may be given to you upon discharge.  Take your pain medication as prescribed, if needed.  If narcotic pain medicine is not needed, then you may take acetaminophen (Tylenol) or ibuprofen (Advil) as needed. °2. Take your usually prescribed medications unless otherwise directed. °If you need a refill on your pain medication, please contact your pharmacy.  They will contact our office to request authorization. Prescriptions will not be filled after 5 pm or on week-ends. °3. You should follow a light diet the first 24 hours after arrival home, such as soup and crackers, etc.  Be sure to include lots of fluids daily.  Resume your normal diet the day after surgery. °4.Most patients will experience some swelling and bruising around the umbilicus or in the groin and scrotum.  Ice packs and reclining will help.  Swelling and bruising can take several days to resolve.  °6. It is common to experience some constipation if taking pain medication after surgery.  Increasing fluid intake and taking a stool softener (such as Colace) will usually help or prevent this problem from occurring.  A mild laxative (Milk of Magnesia or Miralax) should be taken according to package directions if there are no bowel movements after 48 hours. °7. Unless discharge instructions indicate otherwise, you may remove your bandages 24-48 hours after surgery, and you may shower at that time.  You may have steri-strips (small skin tapes) in place directly over the incision.  These strips should be left on the skin for 7-10 days.  If your surgeon used skin glue on the  incision, you may shower in 24 hours.  The glue will flake off over the next 2-3 weeks.  Any sutures or staples will be removed at the office during your follow-up visit. °8. ACTIVITIES:  You may resume regular (light) daily activities beginning the next day--such as daily self-care, walking, climbing stairs--gradually increasing activities as tolerated.  You may have sexual intercourse when it is comfortable.  Refrain from any heavy lifting or straining until approved by your doctor. ° °a.You may drive when you are no longer taking prescription pain medication, you can comfortably wear a seatbelt, and you can safely maneuver your car and apply brakes. °b.RETURN TO WORK:   °_____________________________________________ ° °9.You should see your doctor in the office for a follow-up appointment approximately 2-3 weeks after your surgery.  Make sure that you call for this appointment within a day or two after you arrive home to insure a convenient appointment time. °10.OTHER INSTRUCTIONS: _________________________ °   _____________________________________ ° °WHEN TO CALL YOUR DOCTOR: °1. Fever over 101.0 °2. Inability to urinate °3. Nausea and/or vomiting °4. Extreme swelling or bruising °5. Continued bleeding from incision. °6. Increased pain, redness, or drainage from the incision ° °The clinic staff is available to answer your questions during regular business hours.  Please don’t hesitate to call and ask to speak to one of the nurses for clinical concerns.  If you have a medical emergency, go to the nearest emergency room or call 911.  A surgeon from [facility] is always on call at the hospital ° ° °  [address], [address], [place], [place]  [address] ? ° [address] [address], [place], [place]   [address] °[phone] ? [phone] ? FAX [phone] °Web site: [email or web address] °

## 2017-08-14 NOTE — Progress Notes (Addendum)
No charge note  Would recommend Lisinopril 10 mg daily and clonidine 0.1 bid for now for BP control--Will likely need more agents for control but would not add in acute phase rather recheck as OP with a trend in BP's with a home monitor  Would also d/c on statin lipitor 40, and metformin 500 XR [can be increased as OP barring side effects of diarrhea] for salutatory effect on weight loss on glycemic control--he is not considered diabetic with a1c 6.3 I had a discussion with him about pathophysiology of Diastolic HF and how he can maange the same  He should follow with PCP and get Sleep study--this I believe drives his HTN/Hf Of course weight loss is integral to his care, and if he doesn't lose weight would counsel discussion re: bariatric surgery   Signing off, thank you for the consult  Pleas KochJai Eiden Bagot, MD [facility] [phone]

## 2017-08-14 NOTE — Progress Notes (Signed)
Patient discharged to home with instructions and prescriptions. 

## 2017-08-14 NOTE — Discharge Summary (Signed)
[facility] Discharge Summary   Patient ID: Christopher Schultz MRN: [id number] DOB/AGE: 1970/06/25 47 y.o.  Admit date: 08/11/2017 Discharge date: 08/14/2017  Admitting Diagnosis: Incarcerated umbilical hernia  Discharge Diagnosis Patient Active Problem List   Diagnosis Date Noted  . Hypertension 08/12/2017  . Morbid obesity with BMI of 40.0-44.9, adult (HCC) 08/12/2017  . Acute hyperglycemia 08/12/2017  . OSA on CPAP 08/12/2017  . Hernia, umbilical 08/11/2017  . Umbilical hernia with obstruction 08/11/2017    Consultants Internal medicine  Imaging: No results found.  Procedures Dr. Sheliah Hatch (08/11/17) - Reduction and repair of incarcerated umbilical hernia with mesh  HPI: 47 year old male with known hernia for years presented with acute pain. He noted vomiting and nausea. He denied fevers or chills. He had manually reduced the hernia in the past but was unable to do so on day of presentation.  Hospital Course:  Patient was admitted and underwent procedure listed above.  Tolerated procedure well and was transferred to the floor.  Diet was advanced as tolerated.  On POD#3, the patient was voiding well, tolerating diet, ambulating well, pain well controlled, vital signs stable, incisions c/d/i and felt stable for discharge home.  Patient will follow up in our office in 2 weeks and knows to call with questions or concerns.  He will call to confirm appointment date/time. Patient was discharged in good condition.  Internal medicine was consulted for new onset HTN and recommended discharge on lisinopril and clonidine with outpatient follow up for maintenance/monitoring.   The [place] Substance controlled database was reviewed prior to prescribing narcotic pain medication to this patient.   Allergies as of 08/14/2017   No Known Allergies     Medication List    TAKE these medications   atorvastatin 40 MG tablet Commonly known as:  LIPITOR Take 1 tablet  (40 mg total) by mouth daily at 6 PM.   cloNIDine 0.1 MG tablet Commonly known as:  CATAPRES Take 1 tablet (0.1 mg total) by mouth 2 (two) times daily.   lisinopril 10 MG tablet Commonly known as:  PRINIVIL,ZESTRIL Take 1 tablet (10 mg total) by mouth daily. Start taking on:  08/15/2017   metFORMIN 500 MG 24 hr tablet Commonly known as:  GLUCOPHAGE-XR Take 1 tablet (500 mg total) by mouth daily with breakfast. Start taking on:  08/15/2017   naproxen sodium 220 MG tablet Commonly known as:  ALEVE Take 440 mg by mouth 2 (two) times daily as needed (pain).   oxyCODONE 5 MG immediate release tablet Commonly known as:  Oxy IR/ROXICODONE Take 1-2 tablets (5-10 mg total) by mouth every 4 (four) hours as needed for moderate pain.            Durable Medical Equipment  (From admission, onward)        Start     Ordered   08/13/17 1050  For home use only DME continuous positive airway pressure (CPAP)  Once    Question Answer Comment  Patient has OSA or probable OSA Yes   Is the patient currently using CPAP in the home Yes   If yes (to question two) Determine DME provider and inform them of any new orders/settings   Settings Autotitration   CPAP supplies needed Mask, headgear, cushions, filters, heated tubing and water chamber      08/13/17 1049       Follow-up Information    Kinsinger, De Blanch, MD Follow up.   Specialty:  General Surgery Why:  Call  to confirm appointment date/time. Please arrive 30 min prior to appointment time. Bring photo ID and any insurance information.  Contact information: [address] [place] [address] [phone]        Julieanne MansonMulberry, Elizabeth, MD Follow up.   Specialty:  Internal Medicine Why:  Follow up hospital appointment Wednesday August 30, 2017 at 4 pm Contact information: [address] [place] [address] [phone]        Silvano Bilisrost, Melanie Kirk, MD Follow up.   Specialty:  Family Medicine Contact  information: [address] [facility] [address] [place] [address] [phone]        [facility] Follow up.   Specialty:  Pulmonology Why:  Consult for sleep study September 26, 2017 at 1030 am  Contact information: [address] [place] [id number] [phone]          Signed: Wells GuilesKelly Rayburn , [facility] 08/15/2017, 9:35 AM Pager: [phone] Consults: [phone] Mon-Fri 7:00 am-4:30 pm Sat-Sun 7:00 am-11:30 am

## 2017-08-30 ENCOUNTER — Ambulatory Visit: Payer: Self-pay | Admitting: Internal Medicine

## 2017-09-01 ENCOUNTER — Encounter: Payer: Self-pay | Admitting: Internal Medicine

## 2017-09-01 ENCOUNTER — Ambulatory Visit: Payer: Self-pay | Admitting: Internal Medicine

## 2017-09-01 VITALS — BP 182/120 | HR 78 | Resp 16 | Ht 70.5 in | Wt 292.0 lb

## 2017-09-01 DIAGNOSIS — Z6841 Body Mass Index (BMI) 40.0 and over, adult: Secondary | ICD-10-CM

## 2017-09-01 DIAGNOSIS — G4733 Obstructive sleep apnea (adult) (pediatric): Secondary | ICD-10-CM

## 2017-09-01 DIAGNOSIS — I10 Essential (primary) hypertension: Secondary | ICD-10-CM

## 2017-09-01 DIAGNOSIS — E782 Mixed hyperlipidemia: Secondary | ICD-10-CM

## 2017-09-01 DIAGNOSIS — R7303 Prediabetes: Secondary | ICD-10-CM

## 2017-09-01 LAB — GLUCOSE, POCT (MANUAL RESULT ENTRY): POC GLUCOSE: 137 mg/dL — AB (ref 70–99)

## 2017-09-01 MED ORDER — AMLODIPINE BESYLATE 5 MG PO TABS
5.0000 mg | ORAL_TABLET | Freq: Every day | ORAL | 11 refills | Status: DC
Start: 2017-09-01 — End: 2018-12-17

## 2017-09-01 MED ORDER — ATORVASTATIN CALCIUM 40 MG PO TABS: 40.0000 mg | ORAL_TABLET | Freq: Every day | ORAL | 11 refills | Status: DC

## 2017-09-01 MED ORDER — CLONIDINE HCL 0.1 MG PO TABS: 0.1000 mg | ORAL_TABLET | Freq: Two times a day (BID) | ORAL | 11 refills | Status: DC

## 2017-09-01 MED ORDER — METFORMIN HCL ER 500 MG PO TB24: 500.0000 mg | ORAL_TABLET | Freq: Every day | ORAL | 11 refills | Status: DC

## 2017-09-01 MED ORDER — LISINOPRIL 20 MG PO TABS: ORAL_TABLET | ORAL | 11 refills | Status: DC

## 2017-09-01 NOTE — Progress Notes (Signed)
Subjective:    Patient ID: Christopher Schultz, male    DOB: 02/19/1971, 47 y.o.   MRN: [id number]  HPI   Here to establish  1.  OSA:  Has sleep study coming up on 09/26/17 as mask suffocates him.  This study to fit him for a better mask and get proper pressures.  His last study was many years ago.  2.  DM:  Diagnosed with recent hospitalization in March for incarcerated umbilical hernia.  A1C was actually 6.3%.  Has lost from 325 lbs since last month.    3.  Essential Hypertension:  Diagnosed with this in March with hospitalization as well.  Looks like was on Labetalol and Metoprolol and Hydralazine when in hospital.  Tolerating Clonidine ok.  On low dose of Lisinopril.  He has developed a cough.  4.  Hyperlipidemia:  Was started on Atorvastatin 3 weeks ago as well with his elevated cholesterol.   Lipid Panel     Component Value Date/Time   CHOL 200 08/12/2017 0845   TRIG 86 08/12/2017 0845   HDL 35 (L) 08/12/2017 0845   CHOLHDL 5.7 08/12/2017 0845   VLDL 17 08/12/2017 0845   LDLCALC 148 (H) 08/12/2017 0845    4.  Morbid Obesity: Working on [employer] diet and increasing physical activity.  Current Meds  Medication Sig  . atorvastatin (LIPITOR) 40 MG tablet Take 1 tablet (40 mg total) by mouth daily at 6 PM.  . cloNIDine (CATAPRES) 0.1 MG tablet Take 1 tablet (0.1 mg total) by mouth 2 (two) times daily.  Marland Kitchen. lisinopril (PRINIVIL,ZESTRIL) 20 MG tablet 1 tab by mouth daily  . metFORMIN (GLUCOPHAGE-XR) 500 MG 24 hr tablet Take 1 tablet (500 mg total) by mouth daily with breakfast.  . [DISCONTINUED] atorvastatin (LIPITOR) 40 MG tablet Take 1 tablet (40 mg total) by mouth daily at 6 PM.  . [DISCONTINUED] cloNIDine (CATAPRES) 0.1 MG tablet Take 1 tablet (0.1 mg total) by mouth 2 (two) times daily.  . [DISCONTINUED] lisinopril (PRINIVIL,ZESTRIL) 10 MG tablet Take 1 tablet (10 mg total) by mouth daily.  . [DISCONTINUED] metFORMIN (GLUCOPHAGE-XR) 500 MG 24 hr tablet Take 1 tablet (500 mg total)  by mouth daily with breakfast.    No Known Allergies   Past Medical History:  Diagnosis Date  . Hernia, abdominal   . Hypertension 2019  . Morbid obesity with BMI of 40.0-44.9, adult (HCC)   . Morbid obesity with BMI of 40.0-44.9, adult (HCC)   . OSA (obstructive sleep apnea) 2004  . OSA on CPAP   . Pre-diabetes   . Prediabetes 05/30/2017    Past Surgical History:  Procedure Laterality Date  . ABDOMINAL HERNIA REPAIR  08/11/2017   umbilical herina repair for incarceration  . KNEE SURGERY     left  . UMBILICAL HERNIA REPAIR  08/11/2017   Incarcerated hernia  . UMBILICAL HERNIA REPAIR N/A 08/11/2017   Procedure: HERNIA REPAIR UMBILICAL ADULT OPEN;  Surgeon: Kinsinger, De BlanchLuke Aaron, MD;  [facility];  Service: General;  Laterality: N/A;   Family History  Problem Relation Age of Onset  . Hypertension Mother   . Fibromyalgia Mother   . Diabetes Father   . Hypertension Father   . Peripheral Artery Disease Father   . Hypertension Brother     Social History   Socioeconomic History  . Marital status: Significant Other    Spouse name: Carleene MainsJana Bethea  . Number of children: 2  . Years of education: HS  . Highest education  level: Not on file  Occupational History  . Occupation: Garment/textile technologist    Comment: Corporate treasurer [employer]  Social Needs  . Financial resource strain: Not hard at all  . Food insecurity:    Worry: Never true    Inability: Never true  . Transportation needs:    Medical: No    Non-medical: No  Tobacco Use  . Smoking status: Never Smoker  . Smokeless tobacco: Never Used  Substance and Sexual Activity  . Alcohol use: Yes    Frequency: Never    Comment: ocassionally  . Drug use: Yes    Types: Marijuana    Comment: x 1 month  . Sexual activity: Yes    Birth control/protection: Other-see comments    Comment: fiance' birth control  Lifestyle  . Physical activity:    Days per week: 3 days    Minutes per session: 20 min  . Stress: Only a little    Relationships  . Social connections:    Talks on phone: Not on file    Gets together: Twice a week    Attends religious service: Never    Active member of club or organization: No    Attends meetings of clubs or organizations: Never    Relationship status: Living with partner  . Intimate partner violence:    Fear of current or ex partner: Not on file    Emotionally abused: No    Physically abused: No    Forced sexual activity: Not on file  Other Topics Concern  . Not on file  Social History Narrative  . Not on file  .    Review of Systems     Objective:   Physical Exam NAD HEENT:  PERRL, EOMI discs sharp bilaterally.  TMs pearly gray, throat without injection. Neck: Supple, No adenopathy, + thyromegaly Chest: CTA CV:  RRR with normal S1 and S2, No S3, S4, murmur or rub.  Radial and DP pulses normal and equal.  No LE edema. Abd:  S, NT, No HSM or mass, + BS       Assessment & Plan:  1.  Essential Hypertension:  Not adequately treated.  Add Amlodipine 5 mg daily and increase Lisinopril to 20 mg daily and maintain Clonidine at current dose.  2.  Prediabetes:  Continue Metformin ER 500 mg daily.  3.  Hyperlipidemia: continue Atorvastatin.  Recheck FLP and CMP in 4 weeks with follow up.  4.  Thyromegaly:  TSH with labs in 1 week.

## 2017-09-01 NOTE — Patient Instructions (Signed)
Drink a glass of water before every meal Drink 6-8 glasses of water daily Eat three meals daily Eat a protein and healthy fat with every meal (eggs,fish, chicken, [place]key and limit red meats) Eat 5 servings of vegetables daily, mix the colors Eat 2 servings of fruit daily with skin, if skin is edible Use smaller plates Put food/utensils down as you chew and swallow each bite Eat at a table with friends/family at least once daily, no TV Do not eat in front of the TV  Recent studies show that people who consume all of their calories in a 12 hour period lose weight more efficiently.  For example, if you eat your first meal at 7:00 a.m., your last meal of the day should be completed by 7:00 p.m.Drink a glass of water before every meal Drink 6-8 glasses of water daily Eat three meals daily Eat a protein and healthy fat with every meal (eggs,fish, chicken, [place]key and limit red meats) Eat 5 servings of vegetables daily, mix the colors Eat 2 servings of fruit daily with skin, if skin is edible Use smaller plates Put food/utensils down as you chew and swallow each bite Eat at a table with friends/family at least once daily, no TV Do not eat in front of the TV  Recent studies show that people who consume all of their calories in a 12 hour period lose weight more efficiently.  For example, if you eat your first meal at 7:00 a.m., your last meal of the day should be completed by 7:00 p.m.

## 2017-09-01 NOTE — Progress Notes (Signed)
LCSW completed new screening with patient. Patient reported that he is concerned about sleep apnea and his blood pressure but is not experiencing any mental health symptoms. He denied any issues with social determinants of health. LCSW provided contact info and information about social work services available at the clinic. No follow-up needed at this time.

## 2017-09-08 ENCOUNTER — Other Ambulatory Visit: Payer: Self-pay

## 2017-09-08 VITALS — BP 130/92 | HR 72

## 2017-09-08 DIAGNOSIS — I10 Essential (primary) hypertension: Secondary | ICD-10-CM

## 2017-09-08 NOTE — Progress Notes (Signed)
Patient informed to continue current dose of medication. Patient verbalized understanding. Per Dr. Delrae AlfredMulberry she agrees

## 2017-09-26 ENCOUNTER — Institutional Professional Consult (permissible substitution): Payer: Self-pay | Admitting: Pulmonary Disease

## 2017-10-02 ENCOUNTER — Other Ambulatory Visit: Payer: Self-pay

## 2017-10-03 ENCOUNTER — Other Ambulatory Visit: Payer: Self-pay

## 2017-10-06 ENCOUNTER — Ambulatory Visit: Payer: Self-pay | Admitting: Internal Medicine

## 2018-11-06 ENCOUNTER — Emergency Department (HOSPITAL_COMMUNITY)
Admission: EM | Admit: 2018-11-06 | Discharge: 2018-11-06 | Disposition: A | Discharge status: Home / Self Care | Payer: Self-pay | Attending: Emergency Medicine | Admitting: Emergency Medicine

## 2018-11-06 ENCOUNTER — Other Ambulatory Visit: Payer: Self-pay

## 2018-11-06 DIAGNOSIS — R197 Diarrhea, unspecified: Secondary | ICD-10-CM | POA: Insufficient documentation

## 2018-11-06 DIAGNOSIS — Z79899 Other long term (current) drug therapy: Secondary | ICD-10-CM | POA: Insufficient documentation

## 2018-11-06 DIAGNOSIS — R51 Headache: Secondary | ICD-10-CM | POA: Insufficient documentation

## 2018-11-06 DIAGNOSIS — Z7984 Long term (current) use of oral hypoglycemic drugs: Secondary | ICD-10-CM | POA: Insufficient documentation

## 2018-11-06 DIAGNOSIS — I10 Essential (primary) hypertension: Secondary | ICD-10-CM | POA: Insufficient documentation

## 2018-11-06 LAB — CBC WITH DIFFERENTIAL/PLATELET
Abs Immature Granulocytes: 0.01 10*3/uL (ref 0.00–0.07)
Basophils Absolute: 0 10*3/uL (ref 0.0–0.1)
Basophils Relative: 1 %
Eosinophils Absolute: 0.1 10*3/uL (ref 0.0–0.5)
Eosinophils Relative: 1 %
HCT: 49.2 % (ref 39.0–52.0)
Hemoglobin: 15.9 g/dL (ref 13.0–17.0)
Immature Granulocytes: 0 %
Lymphocytes Relative: 24 %
Lymphs Abs: 1.5 10*3/uL (ref 0.7–4.0)
MCH: 29.6 pg (ref 26.0–34.0)
MCHC: 32.3 g/dL (ref 30.0–36.0)
MCV: 91.4 fL (ref 80.0–100.0)
Monocytes Absolute: 0.4 10*3/uL (ref 0.1–1.0)
Monocytes Relative: 7 %
Neutro Abs: 4 10*3/uL (ref 1.7–7.7)
Neutrophils Relative %: 67 %
Platelets: 295 10*3/uL (ref 150–400)
RBC: 5.38 MIL/uL (ref 4.22–5.81)
RDW: 13.4 % (ref 11.5–15.5)
WBC: 6.1 10*3/uL (ref 4.0–10.5)
nRBC: 0 % (ref 0.0–0.2)

## 2018-11-06 LAB — COMPREHENSIVE METABOLIC PANEL
ALT: 32 U/L (ref 0–44)
AST: 25 U/L (ref 15–41)
Albumin: 4.3 g/dL (ref 3.5–5.0)
Alkaline Phosphatase: 71 U/L (ref 38–126)
Anion gap: 11 (ref 5–15)
BUN: 12 mg/dL (ref 6–20)
CO2: 22 mmol/L (ref 22–32)
Calcium: 9.7 mg/dL (ref 8.9–10.3)
Chloride: 108 mmol/L (ref 98–111)
Creatinine, Ser: 1.28 mg/dL — ABNORMAL HIGH (ref 0.61–1.24)
GFR calc Af Amer: >60 mL/min (ref >60)
GFR calc non Af Amer: >60 mL/min (ref >60)
Glucose, Bld: 132 mg/dL — ABNORMAL HIGH (ref 70–99)
Potassium: 4.2 mmol/L (ref 3.5–5.1)
Sodium: 141 mmol/L (ref 135–145)
Total Bilirubin: 0.5 mg/dL (ref 0.3–1.2)
Total Protein: 7.5 g/dL (ref 6.5–8.1)

## 2018-11-06 MED ORDER — CLONIDINE HCL 0.2 MG PO TABS
0.2000 mg | ORAL_TABLET | Freq: Once | ORAL | Status: AC
  Administered 2018-11-06: 0.2 mg via ORAL
  Filled 2018-11-06: qty 1

## 2018-11-06 NOTE — Discharge Instructions (Addendum)
Please see you primary care provider in regards to your blood pressure which remains NOT controlled. Drink plenty of water and stay out of work until you feel better. Return if you are worse. Thank you for allowing me to care for you today. Please return to the emergency department if you have new or worsening symptoms. Take your medications as instructed.

## 2018-11-06 NOTE — ED Notes (Signed)
Patient verbalizes understanding of discharge instructions. Opportunity for questioning and answers were provided. Armband removed by staff, pt discharged from ED. Pt ambulatory to lobby. Follow up care reviewed 

## 2018-11-06 NOTE — ED Provider Notes (Signed)
[facility] Provider Note   CSN: [id number] Arrival date & time: 11/06/18  1423    History   Chief Complaint Chief Complaint  Patient presents with   Diarrhea   Headache    HPI Angelyn PuntDavid A Mcbreen is a 48 y.o. male.     Patient is a 48 year old male with past medical history of obesity, hypertension, diabetes presenting to the emergency department for headache and diarrhea.  Patient reports this is been going on for 2 days.  Reports that he was sent home from work for last 2 days for the same.  Reports that he has had between 1 and 3 loose stools a day for the last 2 days.  Denies any abdominal pain, dysuria, bloody stools, sick contacts, fever, chills, nausea, vomiting.  Denies any dietary changes or recent antibiotic use.  Reports the headache is mild and comes and goes.  Reports he has not missed any of his blood pressure medications recently.     Past Medical History:  Diagnosis Date   Hernia, abdominal    Hypertension 2019   Morbid obesity with BMI of 40.0-44.9, adult (HCC)    Morbid obesity with BMI of 40.0-44.9, adult (HCC)    OSA (obstructive sleep apnea) 2004   OSA on CPAP    Pre-diabetes    Prediabetes 05/30/2017    Patient Active Problem List   Diagnosis Date Noted   OSA (obstructive sleep apnea)    Hypertension 08/12/2017   Morbid obesity with BMI of 40.0-44.9, adult (HCC) 08/12/2017   Acute hyperglycemia 08/12/2017   Hernia, umbilical 08/11/2017   Umbilical hernia with obstruction 08/11/2017   Prediabetes 05/30/2017    Past Surgical History:  Procedure Laterality Date   ABDOMINAL HERNIA REPAIR  08/11/2017   umbilical herina repair for incarceration   KNEE SURGERY     left   UMBILICAL HERNIA REPAIR  08/11/2017   Incarcerated hernia   UMBILICAL HERNIA REPAIR N/A 08/11/2017   Procedure: HERNIA REPAIR UMBILICAL ADULT OPEN;  Surgeon: Kinsinger, De BlanchLuke Aaron, MD;  Location: [facility] OR;  Service: General;   Laterality: N/A;        Home Medications    Prior to Admission medications   Medication Sig Start Date End Date Taking? Authorizing Provider  amLODipine (NORVASC) 5 MG tablet Take 1 tablet (5 mg total) by mouth daily. 09/01/17  Yes Julieanne MansonMulberry, Elizabeth, MD  atorvastatin (LIPITOR) 40 MG tablet Take 1 tablet (40 mg total) by mouth daily at 6 PM. 09/01/17  Yes Julieanne MansonMulberry, Elizabeth, MD  cloNIDine (CATAPRES) 0.1 MG tablet Take 1 tablet (0.1 mg total) by mouth 2 (two) times daily. 09/01/17  Yes Julieanne MansonMulberry, Elizabeth, MD  lisinopril (PRINIVIL,ZESTRIL) 20 MG tablet 1 tab by mouth daily Patient taking differently: Take 20 mg by mouth daily.  09/01/17  Yes Julieanne MansonMulberry, Elizabeth, MD  metFORMIN (GLUCOPHAGE-XR) 500 MG 24 hr tablet Take 1 tablet (500 mg total) by mouth daily with breakfast. 09/01/17  Yes Julieanne MansonMulberry, Elizabeth, MD  oxyCODONE (OXY IR/ROXICODONE) 5 MG immediate release tablet Take 1-2 tablets (5-10 mg total) by mouth every 4 (four) hours as needed for moderate pain. Patient not taking: Reported on 09/01/2017 08/14/17   Jerre SimonFocht, Jessica L, PA    Family History Family History  Problem Relation Age of Onset   Hypertension Mother    Fibromyalgia Mother    Diabetes Father    Hypertension Father    Peripheral Artery Disease Father    Hypertension Brother     Social History Social  History   Tobacco Use   Smoking status: Never Smoker   Smokeless tobacco: Never Used  Substance Use Topics   Alcohol use: Yes    Frequency: Never    Comment: ocassionally   Drug use: Yes    Types: Marijuana    Comment: x 1 month     Allergies   Patient has no known allergies.   Review of Systems Review of Systems  Constitutional: Negative for activity change, appetite change, chills, diaphoresis, fatigue, fever and unexpected weight change.  HENT: Negative for congestion, rhinorrhea and sore throat.   Respiratory: Negative for cough and shortness of breath.   Cardiovascular: Negative for chest pain,  palpitations and leg swelling.  Gastrointestinal: Positive for diarrhea. Negative for abdominal distention, abdominal pain, blood in stool, constipation, nausea, rectal pain and vomiting.  Endocrine: Negative for polyuria.  Genitourinary: Negative for dysuria and hematuria.  Musculoskeletal: Negative for arthralgias, back pain, myalgias and neck pain.  Skin: Negative for rash and wound.  Neurological: Negative for dizziness, light-headedness and headaches.  Hematological: Does not bruise/bleed easily.     Physical Exam Updated Vital Signs BP (!) 161/111    Pulse 95    Temp 98.4 F (36.9 C) (Oral)    Resp 16    SpO2 98%   Physical Exam Vitals signs and nursing note reviewed.  Constitutional:      General: He is not in acute distress.    Appearance: Normal appearance. He is well-developed. He is not ill-appearing, toxic-appearing or diaphoretic.  HENT:     Head: Normocephalic.     Mouth/Throat:     Mouth: Mucous membranes are moist.  Eyes:     Conjunctiva/sclera: Conjunctivae normal.  Cardiovascular:     Rate and Rhythm: Normal rate and regular rhythm.  Pulmonary:     Effort: Pulmonary effort is normal.     Breath sounds: Normal breath sounds.  Abdominal:     General: Bowel sounds are normal. There is no distension.     Palpations: Abdomen is soft. There is no mass.     Tenderness: There is no abdominal tenderness. There is no guarding.  Skin:    General: Skin is warm and dry.  Neurological:     Mental Status: He is alert.  Psychiatric:        Mood and Affect: Mood normal.      ED Treatments / Results  Labs (all labs ordered are listed, but only abnormal results are displayed) Labs Reviewed  COMPREHENSIVE METABOLIC PANEL - Abnormal; Notable for the following components:      Result Value   Glucose, Bld 132 (*)    Creatinine, Ser 1.28 (*)    All other components within normal limits  CBC WITH DIFFERENTIAL/PLATELET    EKG None  Radiology No results  found.  Procedures Procedures (including critical care time)  Medications Ordered in ED Medications  cloNIDine (CATAPRES) tablet 0.2 mg (0.2 mg Oral Given 11/06/18 1558)     Initial Impression / Assessment and Plan / ED Course  I have reviewed the triage vital signs and the nursing notes.  Pertinent labs & imaging results that were available during my care of the patient were reviewed by me and considered in my medical decision making (see chart for details).  Clinical Course as of Nov 06 1722  Tue Nov 06, 2018  1622 No significant findings on work-up.  Patient creatinine mildly bumped up to 1.28.  This may related to his blood pressure medications or elevated  blood pressure.  I advised him on the importance of following up with his primary care doctor for adjustments in his blood pressure medication.  He was given a dose of 0.2 of clonidine here today without much improvement. Patient has long hx of uncontrolled htn and currently has asymptomatic htn today.  He is completely asymptomatic during his entire stay in the emergency department today.  I believe his diarrhea might just be related to a viral infection versus something he may have ate.  Cannot r/o covid but I think this is highly unlikely. Patient declined covid testing.  In any case, his labs are unremarkable, vitals stable and symptoms very mild without fever. Will plan to send home with PMD f/u. Advised to stay home from work until improved or to be reassessed if any new or worsening symptoms.    [KM]    Clinical Course User Index [KM] Arlyn DunningMcLean, Luisdaniel Kenton A, PA-C         Final Clinical Impressions(s) / ED Diagnoses   Final diagnoses:  Diarrhea, unspecified type  Essential hypertension    ED Discharge Orders    None       Jeral PinchMcLean, Zuley Lutter A, PA-C 11/06/18 1724    Derwood KaplanNanavati, Ankit, MD 11/07/18 1919

## 2018-11-06 NOTE — ED Triage Notes (Signed)
Pt endores diarrhea and headache x 2 days. Denies fever,bodyaches, chills or recent exposure to covid.

## 2018-11-08 ENCOUNTER — Other Ambulatory Visit: Payer: Self-pay | Admitting: Internal Medicine

## 2018-11-15 ENCOUNTER — Other Ambulatory Visit: Payer: Self-pay | Admitting: Internal Medicine

## 2018-12-17 ENCOUNTER — Other Ambulatory Visit: Payer: Self-pay

## 2018-12-17 ENCOUNTER — Encounter (HOSPITAL_COMMUNITY): Payer: Self-pay | Admitting: Emergency Medicine

## 2018-12-17 ENCOUNTER — Emergency Department (HOSPITAL_COMMUNITY)
Admission: EM | Admit: 2018-12-17 | Discharge: 2018-12-17 | Disposition: A | Discharge status: Home / Self Care | Payer: Self-pay | Attending: Emergency Medicine | Admitting: Emergency Medicine

## 2018-12-17 DIAGNOSIS — Z760 Encounter for issue of repeat prescription: Secondary | ICD-10-CM | POA: Insufficient documentation

## 2018-12-17 DIAGNOSIS — I10 Essential (primary) hypertension: Secondary | ICD-10-CM | POA: Insufficient documentation

## 2018-12-17 LAB — CBC
HCT: 48.9 % (ref 39.0–52.0)
Hemoglobin: 15.9 g/dL (ref 13.0–17.0)
MCH: 29.4 pg (ref 26.0–34.0)
MCHC: 32.5 g/dL (ref 30.0–36.0)
MCV: 90.6 fL (ref 80.0–100.0)
Platelets: 282 10*3/uL (ref 150–400)
RBC: 5.4 MIL/uL (ref 4.22–5.81)
RDW: 13.3 % (ref 11.5–15.5)
WBC: 6.2 10*3/uL (ref 4.0–10.5)
nRBC: 0 % (ref 0.0–0.2)

## 2018-12-17 LAB — BASIC METABOLIC PANEL
Anion gap: 8 (ref 5–15)
BUN: 17 mg/dL (ref 6–20)
CO2: 26 mmol/L (ref 22–32)
Calcium: 10 mg/dL (ref 8.9–10.3)
Chloride: 104 mmol/L (ref 98–111)
Creatinine, Ser: 1.42 mg/dL — ABNORMAL HIGH (ref 0.61–1.24)
GFR calc Af Amer: >60 mL/min (ref >60)
GFR calc non Af Amer: 59 mL/min — ABNORMAL LOW (ref >60)
Glucose, Bld: 110 mg/dL — ABNORMAL HIGH (ref 70–99)
Potassium: 4.8 mmol/L (ref 3.5–5.1)
Sodium: 138 mmol/L (ref 135–145)

## 2018-12-17 LAB — TROPONIN I (HIGH SENSITIVITY)
Troponin I (High Sensitivity): 7 ng/L (ref <18)
Troponin I (High Sensitivity): 7 ng/L (ref <18)

## 2018-12-17 MED ORDER — LISINOPRIL 20 MG PO TABS: 20.0000 mg | ORAL_TABLET | Freq: Every day | ORAL | 0 refills | Status: DC

## 2018-12-17 MED ORDER — METFORMIN HCL ER 500 MG PO TB24: 500.0000 mg | ORAL_TABLET | Freq: Every day | ORAL | 0 refills | Status: DC

## 2018-12-17 MED ORDER — ATORVASTATIN CALCIUM 40 MG PO TABS: 40.0000 mg | ORAL_TABLET | Freq: Every day | ORAL | 0 refills | Status: DC

## 2018-12-17 MED ORDER — CLONIDINE HCL 0.1 MG PO TABS: 0.2000 mg | ORAL_TABLET | Freq: Two times a day (BID) | ORAL | 0 refills | Status: DC

## 2018-12-17 MED ORDER — AMLODIPINE BESYLATE 5 MG PO TABS: 5.0000 mg | ORAL_TABLET | Freq: Every day | ORAL | 0 refills | Status: DC

## 2018-12-17 NOTE — Discharge Instructions (Signed)
Please read attached information. If you experience any new or worsening signs or symptoms please return to the emergency room for evaluation. Please follow-up with your primary care provider or specialist as discussed. Please use medication prescribed only as directed and discontinue taking if you have any concerning signs or symptoms.   °

## 2018-12-17 NOTE — ED Triage Notes (Signed)
Pt reports a headache since last night. Pt reports a history a htn and feels his pressure is up.

## 2018-12-17 NOTE — ED Provider Notes (Signed)
[facility] Provider Note   CSN: [id number] Arrival date & time: 12/17/18  0957    History   Chief Complaint Chief Complaint  Patient presents with  . Hypertension    HPI Angelyn PuntDavid A Bodner is a 48 y.o. male.     HPI   48 year old male presents today with complaints hypertension..  Patient notes he woke up this morning with a slight frontal headache similar to previous with no acute neurological deficits or red flags.  He notes he has not had anything to eat today.  He notes he drank water which helped improve his symptoms, he notes he took Tylenol which improved his symptoms.  He denies any chest pain shortness of breath.  He reports he has run out of his blood pressure medication, and took his last clonidine today.  He has not had his other medications for 2 weeks.  He reports he missed an appointment and would not be seen again for several months.   Past Medical History:  Diagnosis Date  . Hernia, abdominal   . Hypertension 2019  . Morbid obesity with BMI of 40.0-44.9, adult (HCC)   . Morbid obesity with BMI of 40.0-44.9, adult (HCC)   . OSA (obstructive sleep apnea) 2004  . OSA on CPAP   . Pre-diabetes   . Prediabetes 05/30/2017    Patient Active Problem List   Diagnosis Date Noted  . OSA (obstructive sleep apnea)   . Hypertension 08/12/2017  . Morbid obesity with BMI of 40.0-44.9, adult (HCC) 08/12/2017  . Acute hyperglycemia 08/12/2017  . Hernia, umbilical 08/11/2017  . Umbilical hernia with obstruction 08/11/2017  . Prediabetes 05/30/2017    Past Surgical History:  Procedure Laterality Date  . ABDOMINAL HERNIA REPAIR  08/11/2017   umbilical herina repair for incarceration  . KNEE SURGERY     left  . UMBILICAL HERNIA REPAIR  08/11/2017   Incarcerated hernia  . UMBILICAL HERNIA REPAIR N/A 08/11/2017   Procedure: HERNIA REPAIR UMBILICAL ADULT OPEN;  Surgeon: Kinsinger, De BlanchLuke Aaron, MD;  Location: [facility] OR;  Service: General;   Laterality: N/A;      Home Medications    Prior to Admission medications   Medication Sig Start Date End Date Taking? Authorizing Provider  amLODipine (NORVASC) 5 MG tablet Take 1 tablet (5 mg total) by mouth daily. 12/17/18   Anaka Beazer, Tinnie GensJeffrey, PA-C  atorvastatin (LIPITOR) 40 MG tablet Take 1 tablet (40 mg total) by mouth daily. 12/17/18   Jowanda Heeg, Tinnie GensJeffrey, PA-C  cloNIDine (CATAPRES) 0.1 MG tablet Take 2 tablets (0.2 mg total) by mouth 2 (two) times daily. 12/17/18   Burdell Peed, Tinnie GensJeffrey, PA-C  lisinopril (ZESTRIL) 20 MG tablet Take 1 tablet (20 mg total) by mouth daily. 12/17/18   Krishiv Sandler, Tinnie GensJeffrey, PA-C  metFORMIN (GLUCOPHAGE-XR) 500 MG 24 hr tablet Take 1 tablet (500 mg total) by mouth daily with breakfast. 12/17/18   Eyvonne MechanicHedges, Cordie Beazley, PA-C    Family History Family History  Problem Relation Age of Onset  . Hypertension Mother   . Fibromyalgia Mother   . Diabetes Father   . Hypertension Father   . Peripheral Artery Disease Father   . Hypertension Brother     Social History Social History   Tobacco Use  . Smoking status: Never Smoker  . Smokeless tobacco: Never Used  Substance Use Topics  . Alcohol use: Yes    Frequency: Never    Comment: ocassionally  . Drug use: Yes    Types: Marijuana    Comment:  x 1 month     Allergies   Patient has no known allergies.   Review of Systems Review of Systems  All other systems reviewed and are negative.   Physical Exam Updated Vital Signs BP (!) 158/113   Pulse 87   Temp 98.1 F (36.7 C) (Oral)   Resp 17   Ht 6' (1.829 m)   Wt (!) 145.2 kg   SpO2 99%   BMI 43.40 kg/m   Physical Exam Vitals signs and nursing note reviewed.  Constitutional:      Appearance: He is well-developed.  HENT:     Head: Normocephalic and atraumatic.  Eyes:     General: No scleral icterus.       Right eye: No discharge.        Left eye: No discharge.     Conjunctiva/sclera: Conjunctivae normal.     Pupils: Pupils are equal, round, and reactive to  light.  Neck:     Musculoskeletal: Normal range of motion.     Vascular: No JVD.     Trachea: No tracheal deviation.  Cardiovascular:     Rate and Rhythm: Normal rate and regular rhythm.  Pulmonary:     Effort: Pulmonary effort is normal.     Breath sounds: No stridor.  Neurological:     General: No focal deficit present.     Mental Status: He is alert and oriented to person, place, and time.     Cranial Nerves: No cranial nerve deficit.     Sensory: No sensory deficit.     Motor: No weakness.     Coordination: Coordination normal.  Psychiatric:        Behavior: Behavior normal.        Thought Content: Thought content normal.        Judgment: Judgment normal.    ED Treatments / Results  Labs (all labs ordered are listed, but only abnormal results are displayed) Labs Reviewed  BASIC METABOLIC PANEL - Abnormal; Notable for the following components:      Result Value   Glucose, Bld 110 (*)    Creatinine, Ser 1.42 (*)    GFR calc non Af Amer 59 (*)    All other components within normal limits  CBC  TROPONIN I (HIGH SENSITIVITY)  TROPONIN I (HIGH SENSITIVITY)    EKG None  Radiology No results found.  Procedures Procedures (including critical care time)  Medications Ordered in ED Medications - No data to display  Initial Impression / Assessment and Plan / ED Course  I have reviewed the triage vital signs and the nursing notes.  Pertinent labs & imaging results that were available during my care of the patient were reviewed by me and considered in my medical decision making (see chart for details).         Assessment/Plan: 48 year old male presents today with complaints of hypertension.  He he has run out of his blood pressure medication and will be restarted.  He also reports headache nonsevere no red flags low suspicion for acute intracranial abnormality.  Patient discharged with strict return precautions follow-up information.  Verbalized understanding and  agreement to today's plan had no further questions or concerns at time of discharge.   Final Clinical Impressions(s) / ED Diagnoses   Final diagnoses:  Hypertension, unspecified type    ED Discharge Orders         Ordered    atorvastatin (LIPITOR) 40 MG tablet  Daily     12/17/18 1639  cloNIDine (CATAPRES) 0.1 MG tablet  2 times daily     12/17/18 1639    lisinopril (ZESTRIL) 20 MG tablet  Daily     12/17/18 1639    metFORMIN (GLUCOPHAGE-XR) 500 MG 24 hr tablet  Daily with breakfast     12/17/18 1639    amLODipine (NORVASC) 5 MG tablet  Daily     12/17/18 1639           Eyvonne MechanicHedges, Evanthia Maund, PA-C 12/17/18 1640    Terrilee FilesButler, Michael C, MD 12/18/18 [phone]

## 2019-02-05 ENCOUNTER — Encounter: Payer: Self-pay | Admitting: Internal Medicine

## 2019-02-05 ENCOUNTER — Ambulatory Visit: Payer: Self-pay | Admitting: Internal Medicine

## 2019-02-05 ENCOUNTER — Other Ambulatory Visit: Payer: Self-pay

## 2019-02-05 VITALS — BP 172/130 | HR 66 | Resp 12 | Ht 70.5 in | Wt 305.0 lb

## 2019-02-05 DIAGNOSIS — Z6841 Body Mass Index (BMI) 40.0 and over, adult: Secondary | ICD-10-CM

## 2019-02-05 DIAGNOSIS — E782 Mixed hyperlipidemia: Secondary | ICD-10-CM | POA: Insufficient documentation

## 2019-02-05 DIAGNOSIS — R7303 Prediabetes: Secondary | ICD-10-CM

## 2019-02-05 DIAGNOSIS — G4733 Obstructive sleep apnea (adult) (pediatric): Secondary | ICD-10-CM

## 2019-02-05 DIAGNOSIS — I10 Essential (primary) hypertension: Secondary | ICD-10-CM

## 2019-02-05 MED ORDER — CLONIDINE HCL 0.1 MG PO TABS: 0.2000 mg | ORAL_TABLET | Freq: Two times a day (BID) | ORAL | 11 refills | Status: DC

## 2019-02-05 MED ORDER — ATORVASTATIN CALCIUM 40 MG PO TABS: 40.0000 mg | ORAL_TABLET | Freq: Every day | ORAL | 11 refills | Status: DC

## 2019-02-05 MED ORDER — METFORMIN HCL ER 500 MG PO TB24: 500.0000 mg | ORAL_TABLET | Freq: Every day | ORAL | 11 refills | Status: DC

## 2019-02-05 MED ORDER — AMLODIPINE BESYLATE 5 MG PO TABS: 5.0000 mg | ORAL_TABLET | Freq: Every day | ORAL | 11 refills | Status: DC

## 2019-02-05 MED ORDER — LISINOPRIL 20 MG PO TABS: 20.0000 mg | ORAL_TABLET | Freq: Every day | ORAL | 11 refills | Status: DC

## 2019-02-05 NOTE — Progress Notes (Signed)
    Subjective:    Patient ID: Christopher Schultz, male   DOB: 1970-12-04, 48 y.o.   MRN: [id number]   HPI  Has not been seen by me since his first visit in 4.2019  1.  Hypertension:  Has stretched out meds until 2 weeks ago and completely out since then.  Was taking Amlodipine, Clonidine and Lisinopril.    2.  Prediabetes:  Has lost some weight since last visit.  Has cut back on volume of food.  Eating more baked food and fish.   A1C back in 08/12/2017.   Last took Metformin ER 3 days ago, but again, has been stretching those out.   Has lost weight since last spring.  3.  OSA:  Cancelled sleep study in 2019.  He is using his CPAP at home, but feels like it is too much pressure.  He thinks it has been more than 10 years since his last sleep study.    No outpatient medications have been marked as taking for the 02/05/19 encounter (Office Visit) with Mack Hook, MD.   No Known Allergies   Review of Systems    Objective:   BP (!) 172/130 (BP Location: Left Arm, Patient Position: Sitting, Cuff Size: Large)   Pulse 66   Resp 12   Ht 5' 10.5" (1.791 m)   Wt (!) 305 lb (138.3 kg)   BMI 43.14 kg/m   Physical Exam  NAD Lungs:  CTA CV:  RRR with normal S1 and S2, No S3, S4 or murmur.  Radial and DP pulses normal and equal. Abd:  S, NT, + BS LE:   No edema.   Assessment & Plan   1.  Hypertension:  Restart Clonidine 0.2 mg twice daily, Lisinopril 20 mg daily, Amlodipine 5 mg daily. Follow up with BMP in 1 week and bp/pulse check.  2.  Prediabetes:  Discussed diet at length.  He is walking nightly for past week. A1C in 3 months Metformin ER 500 mg daily  3.  Hyperlipidemia:  Atorvastatin 40 mg daily.  FLP in 3 months  4.  HM:  Flu vaccine clinic in September and October discussed recommendations to come in one of those days.  5.  OSA;  Once has insurance, will get set up with repeat sleep study and perhaps better fitting mask.

## 2019-02-12 ENCOUNTER — Other Ambulatory Visit: Payer: Self-pay

## 2019-02-12 ENCOUNTER — Other Ambulatory Visit (INDEPENDENT_AMBULATORY_CARE_PROVIDER_SITE_OTHER): Payer: Self-pay

## 2019-02-12 DIAGNOSIS — I10 Essential (primary) hypertension: Secondary | ICD-10-CM

## 2019-02-12 NOTE — Progress Notes (Signed)
Patient came in for BP check. Patient had not taken medication this morning and under a lot of stress. Patient BP remains elevated, better than before but needs to come down some more. Per Dr. Amil Amen have patient come back in 2 weeks and make sure he has taken medication before he comes. Patient verbalized understanding.

## 2019-02-13 LAB — BASIC METABOLIC PANEL
BUN/Creatinine Ratio: 10 (ref 9–20)
BUN: 13 mg/dL (ref 6–24)
CO2: 23 mmol/L (ref 20–29)
Calcium: 9.6 mg/dL (ref 8.7–10.2)
Chloride: 102 mmol/L (ref 96–106)
Creatinine, Ser: 1.29 mg/dL — ABNORMAL HIGH (ref 0.76–1.27)
GFR calc Af Amer: 76 mL/min/{1.73_m2} (ref >59)
GFR calc non Af Amer: 66 mL/min/{1.73_m2} (ref >59)
Glucose: 256 mg/dL — ABNORMAL HIGH (ref 65–99)
Potassium: 4.8 mmol/L (ref 3.5–5.2)
Sodium: 140 mmol/L (ref 134–144)

## 2019-02-26 ENCOUNTER — Ambulatory Visit: Payer: Self-pay

## 2019-02-26 ENCOUNTER — Other Ambulatory Visit: Payer: Self-pay

## 2019-02-26 VITALS — BP 148/102 | HR 86

## 2019-02-26 DIAGNOSIS — I10 Essential (primary) hypertension: Secondary | ICD-10-CM

## 2019-02-26 MED ORDER — AMLODIPINE BESYLATE 10 MG PO TABS: 10.0000 mg | ORAL_TABLET | Freq: Every day | ORAL | 11 refills | Status: DC

## 2019-02-26 NOTE — Progress Notes (Signed)
Patient BP is better but still high. Per Dr. Amil Amen have patient increase his amlodipine to 10 mg daily in conjunction with his lisinopril and clonidine. Patient to come back for repeat BP in 2 weeks. Patient verbalized understanding and 5 mg discontinued and 10 mg sent to [facility]

## 2019-03-12 ENCOUNTER — Other Ambulatory Visit: Payer: Self-pay

## 2019-03-12 ENCOUNTER — Encounter: Payer: Self-pay | Admitting: Internal Medicine

## 2019-03-12 ENCOUNTER — Ambulatory Visit: Payer: Self-pay | Admitting: Internal Medicine

## 2019-03-12 VITALS — BP 130/82 | HR 78 | Resp 12 | Ht 70.5 in | Wt 311.0 lb

## 2019-03-12 DIAGNOSIS — R05 Cough: Secondary | ICD-10-CM

## 2019-03-12 DIAGNOSIS — R7303 Prediabetes: Secondary | ICD-10-CM

## 2019-03-12 DIAGNOSIS — T464X5A Adverse effect of angiotensin-converting-enzyme inhibitors, initial encounter: Secondary | ICD-10-CM

## 2019-03-12 DIAGNOSIS — E1165 Type 2 diabetes mellitus with hyperglycemia: Secondary | ICD-10-CM

## 2019-03-12 DIAGNOSIS — I10 Essential (primary) hypertension: Secondary | ICD-10-CM

## 2019-03-12 LAB — GLUCOSE, POCT (MANUAL RESULT ENTRY): POC Glucose: 526 mg/dl — AB (ref 70–99)

## 2019-03-12 MED ORDER — GLIPIZIDE 5 MG PO TABS: 5.0000 mg | ORAL_TABLET | Freq: Two times a day (BID) | ORAL | 11 refills | Status: DC

## 2019-03-12 MED ORDER — LOSARTAN POTASSIUM 100 MG PO TABS: 100.0000 mg | ORAL_TABLET | Freq: Every day | ORAL | 11 refills | Status: DC

## 2019-03-12 MED ORDER — METFORMIN HCL 500 MG PO TABS: ORAL_TABLET | ORAL | 11 refills | Status: DC

## 2019-03-12 NOTE — Progress Notes (Signed)
    Subjective:    Patient ID: Christopher Schultz, male   DOB: 06-29-1970, 48 y.o.   MRN: [id number]   HPI   1.  DM:  No longer in prediabetic range with blood glucose above 500. Here for a bp check, but complained of polyuria and polydipsia, so blood glucose checked.  Urinating a lot--up last night 7 times.  Yesterday breakfast:  Smoothie with spinach, raspberries, almond milk, bananas.  6 p.m.:  Baked chicken, [place] blend veggies.  Water       2.  Cough--dry and causes him to gag.  In paroxysms.  He thinks it is the Lisinopril  Current Meds  Medication Sig  . amLODipine (NORVASC) 10 MG tablet Take 1 tablet (10 mg total) by mouth daily.  Marland Kitchen atorvastatin (LIPITOR) 40 MG tablet Take 1 tablet (40 mg total) by mouth daily.  . cloNIDine (CATAPRES) 0.1 MG tablet Take 2 tablets (0.2 mg total) by mouth 2 (two) times daily.  Marland Kitchen lisinopril (ZESTRIL) 20 MG tablet Take 1 tablet (20 mg total) by mouth daily.  . metFORMIN (GLUCOPHAGE-XR) 500 MG 24 hr tablet Take 1 tablet (500 mg total) by mouth daily with breakfast.   No Known Allergies   Review of Systems    Objective:   BP 130/82 (BP Location: Left Arm, Patient Position: Sitting, Cuff Size: Large)   Pulse 78   Resp 12   Ht 5' 10.5" (1.791 m)   Wt (!) 311 lb (141.1 kg)   BMI 43.99 kg/m   Physical Exam NAD Lungs:  CTA CV:  RRR without murmur or rub.  Radial pulses normal and equal  Assessment & Plan   1.  New onset DM:  Was being treated for prediabetes.  To finish his current bottle of Metformin ER 500 mg and increase to twice daily. Then regular Metformin 500 mg twice daily with plans to increase to 1000 mg twice daily as tolerated. Add Glipizide 5 mg twice daily. Diet is pretty good at this point--just suggested more vegetables.  2.  ACE I cough:  Stop Lisinopril.  Losartan 100 mg daily.  3.  Hypertension:  Improved control.

## 2019-03-12 NOTE — Patient Instructions (Signed)
Has form for free influenza vaccine for Thursday.

## 2019-03-14 ENCOUNTER — Encounter: Payer: Self-pay | Admitting: Internal Medicine

## 2019-03-18 ENCOUNTER — Other Ambulatory Visit: Payer: Self-pay

## 2019-03-18 ENCOUNTER — Telehealth: Payer: Self-pay | Admitting: Internal Medicine

## 2019-03-18 DIAGNOSIS — Z20822 Contact with and (suspected) exposure to covid-19: Secondary | ICD-10-CM

## 2019-03-18 NOTE — Telephone Encounter (Signed)
Patient called requesting a call back to be advised on what needs to be done  due to be feeling ill for three days. Patient stated has been having stomach concerns, feels fatigue and had 102 temperature 2 days ago; patient stated one thing is making him really worried is the sharpening needle sensation  on his heart.  Routing to Cherice to call back patient.

## 2019-03-18 NOTE — Telephone Encounter (Signed)
Spoke with patient informed he need to go get tested for COVID-19. Patient was at work. Informed he needs to leave now and quarantine until he gets his results back. Patient having cough, fever, body aches and some shortness of breath with cough. Patient informed to go to [employer] testing site. Patient verbalized understanding and informed we will call and check on him tomorrow. Dr. Amil Amen. Patient also educated on all precautions until results come back.

## 2019-03-20 ENCOUNTER — Telehealth: Payer: Self-pay

## 2019-03-20 ENCOUNTER — Emergency Department (HOSPITAL_COMMUNITY)
Admission: EM | Admit: 2019-03-20 | Discharge: 2019-03-20 | Disposition: A | Discharge status: Home / Self Care | Payer: Self-pay | Attending: Emergency Medicine | Admitting: Emergency Medicine

## 2019-03-20 ENCOUNTER — Emergency Department (HOSPITAL_COMMUNITY): Payer: Self-pay

## 2019-03-20 DIAGNOSIS — I10 Essential (primary) hypertension: Secondary | ICD-10-CM | POA: Insufficient documentation

## 2019-03-20 DIAGNOSIS — U071 COVID-19: Secondary | ICD-10-CM | POA: Insufficient documentation

## 2019-03-20 DIAGNOSIS — Z79899 Other long term (current) drug therapy: Secondary | ICD-10-CM | POA: Insufficient documentation

## 2019-03-20 LAB — CBC WITH DIFFERENTIAL/PLATELET
Abs Immature Granulocytes: 0 10*3/uL (ref 0.00–0.07)
Basophils Absolute: 0 10*3/uL (ref 0.0–0.1)
Basophils Relative: 0 %
Eosinophils Absolute: 0 10*3/uL (ref 0.0–0.5)
Eosinophils Relative: 0 %
HCT: 47.8 % (ref 39.0–52.0)
Hemoglobin: 15.9 g/dL (ref 13.0–17.0)
Lymphocytes Relative: 21 %
Lymphs Abs: 1.6 10*3/uL (ref 0.7–4.0)
MCH: 29.5 pg (ref 26.0–34.0)
MCHC: 33.3 g/dL (ref 30.0–36.0)
MCV: 88.7 fL (ref 80.0–100.0)
Monocytes Absolute: 0.3 10*3/uL (ref 0.1–1.0)
Monocytes Relative: 4 %
Neutro Abs: 5.6 10*3/uL (ref 1.7–7.7)
Neutrophils Relative %: 75 %
Platelets: 228 10*3/uL (ref 150–400)
RBC: 5.39 MIL/uL (ref 4.22–5.81)
RDW: 12.6 % (ref 11.5–15.5)
WBC: 7.5 10*3/uL (ref 4.0–10.5)
nRBC: 0 % (ref 0.0–0.2)
nRBC: 0 /100 WBC

## 2019-03-20 LAB — COMPREHENSIVE METABOLIC PANEL
ALT: 20 U/L (ref 0–44)
AST: 21 U/L (ref 15–41)
Albumin: 3.5 g/dL (ref 3.5–5.0)
Alkaline Phosphatase: 65 U/L (ref 38–126)
Anion gap: 13 (ref 5–15)
BUN: 10 mg/dL (ref 6–20)
CO2: 23 mmol/L (ref 22–32)
Calcium: 8.9 mg/dL (ref 8.9–10.3)
Chloride: 98 mmol/L (ref 98–111)
Creatinine, Ser: 1.24 mg/dL (ref 0.61–1.24)
GFR calc Af Amer: >60 mL/min (ref >60)
GFR calc non Af Amer: >60 mL/min (ref >60)
Glucose, Bld: 279 mg/dL — ABNORMAL HIGH (ref 70–99)
Potassium: 4 mmol/L (ref 3.5–5.1)
Sodium: 134 mmol/L — ABNORMAL LOW (ref 135–145)
Total Bilirubin: 0.8 mg/dL (ref 0.3–1.2)
Total Protein: 7.2 g/dL (ref 6.5–8.1)

## 2019-03-20 LAB — D-DIMER, QUANTITATIVE: D-Dimer, Quant: 0.37 ug/mL-FEU (ref 0.00–0.50)

## 2019-03-20 LAB — NOVEL CORONAVIRUS, NAA: SARS-CoV-2, NAA: DETECTED — AB

## 2019-03-20 MED ORDER — SODIUM CHLORIDE 0.9 % IV BOLUS
1000.0000 mL | Freq: Once | INTRAVENOUS | Status: AC
  Administered 2019-03-20: 1000 mL via INTRAVENOUS

## 2019-03-20 MED ORDER — ALBUTEROL SULFATE HFA 108 (90 BASE) MCG/ACT IN AERS
4.0000 | INHALATION_SPRAY | Freq: Once | RESPIRATORY_TRACT | Status: AC
  Administered 2019-03-20: 16:00:00 4 via RESPIRATORY_TRACT
  Filled 2019-03-20: qty 6.7

## 2019-03-20 MED ORDER — ONDANSETRON HCL 4 MG/2ML IJ SOLN
4.0000 mg | Freq: Once | INTRAMUSCULAR | Status: AC
  Administered 2019-03-20: 16:00:00 4 mg via INTRAVENOUS
  Filled 2019-03-20: qty 2

## 2019-03-20 NOTE — ED Triage Notes (Signed)
Pt here via [facility] after testing positive today after he became sob.     BP 140/100  HR 122 O2 97 RR 18 CBG 279 Temp 97.2

## 2019-03-20 NOTE — Discharge Instructions (Signed)
You have COVID-19.  Fortunately, you are able to go home today.  We will treat you with some medications that will help with your symptoms.  Be sure to stay hydrated with lots of fluids.  If you feel like your breathing is worsening please come to the emergency department.  Thank you for allowing me to care for you today. Please return to the emergency department if you have new or worsening symptoms. Take your medications as instructed.

## 2019-03-20 NOTE — ED Notes (Signed)
Pt O2 sat @ 100% @ bedside. During ambulation O2 SAT 96%. Pt returned safely to bedside O2 sat @ 97%.

## 2019-03-20 NOTE — ED Provider Notes (Signed)
[facility] Provider Note   CSN: [id number] Arrival date & time: 03/20/19  1236     History   Chief Complaint Chief Complaint  Patient presents with   Shortness of Breath    HPI Christopher Schultz is a 48 y.o. male.     Patient is a 48 year old gentleman with past medical history of morbid obesity, sleep apnea, hyperlipidemia, hypertension presenting to the emergency department for shortness of breath.  Patient tested positive for COVID-19 on October 19.  Symptoms began last Wednesday.  Reports that they got worse the next day on Thursday.  Reports he has been feeling generally weak with shortness of breath.  Has had some mild diarrhea and one episode of vomiting.  Reports loss of taste and smell.  He is able to keep down foods but reports that he has anorexia.     Past Medical History:  Diagnosis Date   Hernia, abdominal    Hypertension 2019   Morbid obesity with BMI of 40.0-44.9, adult (HCC)    Morbid obesity with BMI of 40.0-44.9, adult (HCC)    OSA (obstructive sleep apnea) 2004   OSA on CPAP    Pre-diabetes    Prediabetes 05/30/2017    Patient Active Problem List   Diagnosis Date Noted   Mixed hyperlipidemia 02/05/2019   OSA (obstructive sleep apnea)    Hypertension 08/12/2017   Morbid obesity with BMI of 40.0-44.9, adult (HCC) 08/12/2017   Acute hyperglycemia 08/12/2017   Hernia, umbilical 08/11/2017   Umbilical hernia with obstruction 08/11/2017   Prediabetes 05/30/2017    Past Surgical History:  Procedure Laterality Date   ABDOMINAL HERNIA REPAIR  08/11/2017   umbilical herina repair for incarceration   KNEE SURGERY     left   UMBILICAL HERNIA REPAIR  08/11/2017   Incarcerated hernia   UMBILICAL HERNIA REPAIR N/A 08/11/2017   Procedure: HERNIA REPAIR UMBILICAL ADULT OPEN;  Surgeon: Kinsinger, De BlanchLuke Aaron, MD;  Location: [facility] OR;  Service: General;  Laterality: N/A;        Home Medications     Prior to Admission medications   Medication Sig Start Date End Date Taking? Authorizing Provider  amLODipine (NORVASC) 10 MG tablet Take 1 tablet (10 mg total) by mouth daily. 02/26/19   Julieanne MansonMulberry, Elizabeth, MD  atorvastatin (LIPITOR) 40 MG tablet Take 1 tablet (40 mg total) by mouth daily. 02/05/19   Julieanne MansonMulberry, Elizabeth, MD  cloNIDine (CATAPRES) 0.1 MG tablet Take 2 tablets (0.2 mg total) by mouth 2 (two) times daily. 02/05/19   Julieanne MansonMulberry, Elizabeth, MD  glipiZIDE (GLUCOTROL) 5 MG tablet Take 1 tablet (5 mg total) by mouth 2 (two) times daily before a meal. 03/12/19   Julieanne MansonMulberry, Elizabeth, MD  losartan (COZAAR) 100 MG tablet Take 1 tablet (100 mg total) by mouth daily. 03/12/19   Julieanne MansonMulberry, Elizabeth, MD  metFORMIN (GLUCOPHAGE) 500 MG tablet 1 tab by mouth twice daily with meals 03/12/19   Julieanne MansonMulberry, Elizabeth, MD    Family History Family History  Problem Relation Age of Onset   Hypertension Mother    Fibromyalgia Mother    Diabetes Father    Hypertension Father    Peripheral Artery Disease Father    Hypertension Brother     Social History Social History   Tobacco Use   Smoking status: Never Smoker   Smokeless tobacco: Never Used  Substance Use Topics   Alcohol use: Yes    Frequency: Never    Comment: ocassionally   Drug use: Yes  Types: Marijuana    Comment: x 1 month     Allergies   Patient has no known allergies.   Review of Systems Review of Systems  Constitutional: Positive for appetite change and fatigue. Negative for chills, diaphoresis and fever.  HENT: Negative for congestion, rhinorrhea and sinus pain.   Respiratory: Positive for cough and shortness of breath.   Cardiovascular: Negative for chest pain, palpitations and leg swelling.  Gastrointestinal: Positive for abdominal pain, diarrhea and vomiting. Negative for nausea.  Genitourinary: Negative for dysuria and frequency.  Musculoskeletal: Negative for back pain.  Skin: Negative for rash.   Neurological: Positive for weakness (generalized). Negative for dizziness, light-headedness and headaches.     Physical Exam Updated Vital Signs BP (!) 128/98 (BP Location: Right Arm)    Pulse (!) 106    Temp 99.8 F (37.7 C) (Oral)    Resp (!) 28    SpO2 98%   Physical Exam Vitals signs and nursing note reviewed.  Constitutional:      General: He is not in acute distress.    Appearance: He is well-developed. He is obese. He is not ill-appearing, toxic-appearing or diaphoretic.  HENT:     Head: Normocephalic and atraumatic.     Mouth/Throat:     Mouth: Mucous membranes are moist.  Eyes:     Pupils: Pupils are equal, round, and reactive to light.  Cardiovascular:     Rate and Rhythm: Regular rhythm. Tachycardia present.  Pulmonary:     Effort: Pulmonary effort is normal. No tachypnea.     Breath sounds: Examination of the right-middle field reveals wheezing. Examination of the left-middle field reveals wheezing. Wheezing present.  Chest:     Chest wall: No mass or edema.  Musculoskeletal:     Right lower leg: He exhibits no tenderness. No edema.     Left lower leg: He exhibits no tenderness. No edema.  Skin:    General: Skin is warm.  Neurological:     General: No focal deficit present.     Mental Status: He is alert.  Psychiatric:        Mood and Affect: Mood normal.      ED Treatments / Results  Labs (all labs ordered are listed, but only abnormal results are displayed) Labs Reviewed  COMPREHENSIVE METABOLIC PANEL - Abnormal; Notable for the following components:      Result Value   Sodium 134 (*)    Glucose, Bld 279 (*)    All other components within normal limits  CBC WITH DIFFERENTIAL/PLATELET  D-DIMER, QUANTITATIVE (NOT AT [facility])    EKG EKG Interpretation  Date/Time:  Wednesday March 20 2019 12:49:16 EDT Ventricular Rate:  110 PR Interval:    QRS Duration: 99 QT Interval:  323 QTC Calculation: 437 R Axis:   6 Text Interpretation:  Sinus  tachycardia Borderline repolarization abnormality No significant change since last tracing Confirmed by Deno Etienne [phone]) on 03/20/2019 2:20:07 PM   Radiology Dg Chest Portable 1 View  Result Date: 03/20/2019 CLINICAL DATA:  Shortness of breath EXAM: PORTABLE CHEST 1 VIEW COMPARISON:  None. FINDINGS: Lungs are clear. Heart is mildly enlarged with pulmonary vascularity normal. No adenopathy. No bone lesions. IMPRESSION: Mild cardiac enlargement.  No edema or consolidation. Electronically Signed   By: Lowella Grip III M.D.   On: 03/20/2019 14:44    Procedures Procedures (including critical care time)  Medications Ordered in ED Medications  albuterol (VENTOLIN HFA) 108 (90 Base) MCG/ACT inhaler 4 puff (has no  administration in time range)  ondansetron (ZOFRAN) injection 4 mg (has no administration in time range)  sodium chloride 0.9 % bolus 1,000 mL (1,000 mLs Intravenous New Bag/Given 03/20/19 1332)     Initial Impression / Assessment and Plan / ED Course  I have reviewed the triage vital signs and the nursing notes.  Pertinent labs & imaging results that were available during my care of the patient were reviewed by me and considered in my medical decision making (see chart for details).  Clinical Course as of Mar 20 1527  Wed Mar 20, 2019  1524 Covid positive patient presenting to the emergency department for shortness of breath and fatigue.  On my exam he is mildly tachycardic and tachypneic.  Labs were drawn and patient was treated with a liter of fluids and albuterol inhaler.  Patient feeling improved after this.  With ambulation patient has oxygen saturations that do not drop below 96%.  D-dimer was negative.  Chest x-ray clear of edema or consolidation.  EKG without significant changes.  Patient does not meet admission criteria and he does want to go home.  We will treat him with Bromfed for his symptoms.  He is tolerating p.o.  Advised on strict return precautions to the  emergency department.   [KM]    Clinical Course User Index [KM] Arlyn Dunning, PA-C       Based on review of vitals, medical screening exam, lab work and/or imaging, there does not appear to be an acute, emergent etiology for the patient's symptoms. Counseled pt on good return precautions and encouraged both PCP and ED follow-up as needed.  Prior to discharge, I also discussed incidental imaging findings with patient in detail and advised appropriate, recommended follow-up in detail.  Clinical Impression: 1. COVID-19     Disposition: Discharge  Prior to providing a prescription for a controlled substance, I independently reviewed the patient's recent prescription history on the [place] Controlled Substance Reporting System. The patient had no recent or regular prescriptions and was deemed appropriate for a brief, less than 3 day prescription of narcotic for acute analgesia.  This note was prepared with assistance of Conservation officer, historic buildings. Occasional wrong-word or sound-a-like substitutions may have occurred due to the inherent limitations of voice recognition software.   Final Clinical Impressions(s) / ED Diagnoses   Final diagnoses:  COVID-19    ED Discharge Orders    None       Arlyn Dunning, PA-C 03/20/19 1528    Melene Plan, DO 03/25/19 1616

## 2019-03-20 NOTE — Telephone Encounter (Signed)
'   Spoke with patient. Positive for COVID-19. Reviewed all COVID-19 precautions and instructions with patient. Patient verbalized understanding of self isolation and when to go to ER if not feeling better. Patient with fever, cough, sl shortness of breath and extreme fatigue. Patient states he has made place of employment aware of situation and states another employee tested positive as well. Informed to call the office if he is not getting better.

## 2019-03-21 ENCOUNTER — Telehealth: Payer: Self-pay

## 2019-03-21 NOTE — Telephone Encounter (Signed)
Spoke with patient. States he is feeling a little bit better. States he went to ER yesterday and was given a inhaler. States that has helped a lot with his breathing and feeling short of breath. Patient states chest pain is gone. Still having loss of taste and smell. The fatigue is a little bit better and he still has a cough. Patient states he is not running a fever right now. States he is isolated in his bedroom and he is getting up walking around the room to keep moving. Patient over all feels like he is getting better.

## 2019-03-28 ENCOUNTER — Telehealth: Payer: Self-pay

## 2019-03-28 NOTE — Telephone Encounter (Signed)
Spoke with patient. Informed needed specific symptoms that he is having. Patient states he is still having diarrhea, no smell or taste and fatigue and no appetite. Patient states he is drinking ensure just to keep up his nourishment. Patient states his boil is draining a lot and the pressure is being relieved now that it is draining. Informed that he has a letter and he can pick it up. Per patient giving verbal permission asked if we could call Cindy in HR and get her information and send letter directly to her. Cindy phone # [phone].   Spoke with cindy. States they just need a estimated time for patient to return to work. Informed we did not have that just yet but will revisit his symptoms on Monday. Cindy asked if we could e-mail letter. letter scanned to [place] and emailed to [email or web address]. informed patient letter was sent and will speak with him on monday. Patient verbalized understanding.

## 2019-03-28 NOTE — Telephone Encounter (Signed)
patient called asking for work note. States job does not believe he is sick. Patient states he is still having symptoms and he has not self isolated for 10 days yet. Boil has opened as of this morning and is draining.  To Dr Amil Amen for note

## 2019-04-01 ENCOUNTER — Telehealth: Payer: Self-pay

## 2019-04-01 NOTE — Telephone Encounter (Signed)
Patient called asking for a return to work note. Patient states he has been 5 days fever free without taking anything to keep fever down and all other symptoms have subsided. Patient states he even got his taste and smell back. Patient asking for note to return on Wednesday. Patient also states the boil on buttock is still draining but looks a lot better.  Patient symptoms originally started on 10/15 and left work on 03/18/2019 to get tested and results came back positive on 03/20/2019.  To Dr. Amil Amen to write letter.

## 2019-04-02 NOTE — Telephone Encounter (Signed)
Letter to return to work given to patient.

## 2019-04-03 ENCOUNTER — Telehealth: Payer: Self-pay | Admitting: Internal Medicine

## 2019-04-03 NOTE — Telephone Encounter (Signed)
Spoke with patient. States job is asking for a letter stating his other health issues are not going to be an issue. Informed patient he was out because of COVID-19 and we cleared him to go back. I called Cindy in HR to get a better understanding. Left message for her to call me back. Informed patient once I hear from her I will let him know. Patient verbalized understanding.

## 2019-04-03 NOTE — Telephone Encounter (Signed)
Patient called requesting a call back from [place] to discuss a request for a different  letter where stated can return to work without any restrictions.    Routing to Cherice to call back patient.

## 2019-05-06 ENCOUNTER — Other Ambulatory Visit: Payer: Self-pay

## 2019-05-07 ENCOUNTER — Other Ambulatory Visit (INDEPENDENT_AMBULATORY_CARE_PROVIDER_SITE_OTHER): Payer: No Typology Code available for payment source

## 2019-05-07 VITALS — BP 128/88 | HR 105

## 2019-05-07 DIAGNOSIS — E782 Mixed hyperlipidemia: Secondary | ICD-10-CM

## 2019-05-07 DIAGNOSIS — I10 Essential (primary) hypertension: Secondary | ICD-10-CM

## 2019-05-07 DIAGNOSIS — E1165 Type 2 diabetes mellitus with hyperglycemia: Secondary | ICD-10-CM

## 2019-05-07 DIAGNOSIS — Z79899 Other long term (current) drug therapy: Secondary | ICD-10-CM

## 2019-05-08 LAB — COMPREHENSIVE METABOLIC PANEL
ALT: 21 IU/L (ref 0–44)
AST: 12 IU/L (ref 0–40)
Albumin/Globulin Ratio: 1.7 (ref 1.2–2.2)
Albumin: 4.3 g/dL (ref 4.0–5.0)
Alkaline Phosphatase: 92 IU/L (ref 39–117)
BUN/Creatinine Ratio: 14 (ref 9–20)
BUN: 14 mg/dL (ref 6–24)
Bilirubin Total: 0.2 mg/dL (ref 0.0–1.2)
CO2: 24 mmol/L (ref 20–29)
Calcium: 9.5 mg/dL (ref 8.7–10.2)
Chloride: 102 mmol/L (ref 96–106)
Creatinine, Ser: 1.01 mg/dL (ref 0.76–1.27)
GFR calc Af Amer: 102 mL/min/{1.73_m2} (ref >59)
GFR calc non Af Amer: 88 mL/min/{1.73_m2} (ref >59)
Globulin, Total: 2.5 g/dL (ref 1.5–4.5)
Glucose: 205 mg/dL — ABNORMAL HIGH (ref 65–99)
Potassium: 5.2 mmol/L (ref 3.5–5.2)
Sodium: 140 mmol/L (ref 134–144)
Total Protein: 6.8 g/dL (ref 6.0–8.5)

## 2019-05-08 LAB — LIPID PANEL W/O CHOL/HDL RATIO
Cholesterol, Total: 129 mg/dL (ref 100–199)
HDL: 38 mg/dL — ABNORMAL LOW (ref >39)
LDL Chol Calc (NIH): 77 mg/dL (ref 0–99)
Triglycerides: 69 mg/dL (ref 0–149)
VLDL Cholesterol Cal: 14 mg/dL (ref 5–40)

## 2019-05-08 LAB — HGB A1C W/O EAG: Hgb A1c MFr Bld: 11.9 % — ABNORMAL HIGH (ref 4.8–5.6)

## 2019-05-10 ENCOUNTER — Other Ambulatory Visit: Payer: Self-pay

## 2019-05-10 ENCOUNTER — Telehealth: Payer: Self-pay | Admitting: Internal Medicine

## 2019-05-10 NOTE — Progress Notes (Signed)
Patient did not pick up when text sent x 2.  Called to see if he was getting texts, but did not pick up phone call as well.  Message left.

## 2019-07-23 ENCOUNTER — Encounter: Payer: Self-pay | Admitting: Internal Medicine

## 2019-07-23 ENCOUNTER — Ambulatory Visit (INDEPENDENT_AMBULATORY_CARE_PROVIDER_SITE_OTHER): Payer: Self-pay | Admitting: Internal Medicine

## 2019-07-23 ENCOUNTER — Other Ambulatory Visit: Payer: Self-pay

## 2019-07-23 VITALS — BP 138/98 | HR 84 | Resp 12 | Ht 70.5 in | Wt 312.0 lb

## 2019-07-23 DIAGNOSIS — I10 Essential (primary) hypertension: Secondary | ICD-10-CM

## 2019-07-23 DIAGNOSIS — Z23 Encounter for immunization: Secondary | ICD-10-CM

## 2019-07-23 DIAGNOSIS — E782 Mixed hyperlipidemia: Secondary | ICD-10-CM

## 2019-07-23 DIAGNOSIS — E1165 Type 2 diabetes mellitus with hyperglycemia: Secondary | ICD-10-CM

## 2019-07-23 DIAGNOSIS — G4733 Obstructive sleep apnea (adult) (pediatric): Secondary | ICD-10-CM

## 2019-07-23 MED ORDER — METFORMIN HCL 1000 MG PO TABS: ORAL_TABLET | ORAL | 3 refills | Status: DC

## 2019-07-23 MED ORDER — AMLODIPINE BESYLATE 10 MG PO TABS: 10.0000 mg | ORAL_TABLET | Freq: Every day | ORAL | 3 refills | Status: DC

## 2019-07-23 NOTE — Patient Instructions (Signed)
Drink a glass of water before every meal Drink 6-8 glasses of water daily Eat three meals daily Eat a protein and healthy fat with every meal (eggs,fish, chicken, [place] and limit red meats) Eat 5 servings of vegetables daily, mix the colors Eat 2 servings of fruit daily with skin, if skin is edible Use smaller plates Put food/utensils down as you chew and swallow each bite Eat at a table with friends/family at least once daily, no TV Do not eat in front of the TV  Recent studies show that people who consume all of their calories in a 12 hour period lose weight more efficiently.  For example, if you eat your first meal at 7:00 a.m., your last meal of the day should be completed by 7:00 p.m.  Unsweetened almond milk!!!

## 2019-07-23 NOTE — Progress Notes (Signed)
    Subjective:    Patient ID: Christopher Schultz, male   DOB: [birth year], 49 y.o.   MRN: [id number]   HPI   1.  DM:  Checked sugar yesterday and was 107.  Checks in the morning 3 times weekly and never higher than 180.   When his A1C was high in December, he was not missing his medication. Taking metformin and Glipizide.    2.  Hypertension:  Not missing Amlodipine, clonidine or Losartan.  3.  Hyperlipidemia:  Last cholesterol panel not at goal with HDL and LDL.    Lipid Panel     Component Value Date/Time   CHOL 129 05/07/2019 0912   TRIG 69 05/07/2019 0912   HDL 38 (L) 05/07/2019 0912   CHOLHDL 5.7 08/12/2017 0845   VLDL 17 08/12/2017 0845   LDLCALC 77 05/07/2019 0912   LABVLDL 14 05/07/2019 0912     4.  OSA:  Has insurance now--ready for sleep study to get set back up.  Current Meds  Medication Sig  . amLODipine (NORVASC) 10 MG tablet Take 1 tablet (10 mg total) by mouth daily.  Marland Kitchen atorvastatin (LIPITOR) 40 MG tablet Take 1 tablet (40 mg total) by mouth daily.  . cloNIDine (CATAPRES) 0.1 MG tablet Take 2 tablets (0.2 mg total) by mouth 2 (two) times daily.  Marland Kitchen glipiZIDE (GLUCOTROL) 5 MG tablet Take 1 tablet (5 mg total) by mouth 2 (two) times daily before a meal.  . losartan (COZAAR) 100 MG tablet Take 1 tablet (100 mg total) by mouth daily.  . metFORMIN (GLUCOPHAGE) 500 MG tablet 1 tab by mouth twice daily with meals   No Known Allergies   Review of Systems    Objective:   BP (!) 138/98 (BP Location: Left Arm, Patient Position: Sitting, Cuff Size: Large)   Pulse 84   Resp 12   Ht 5' 10.5" (1.791 m)   Wt (!) 312 lb (141.5 kg)   BMI 44.13 kg/m   Physical Exam  NAD HEENT:  PERRL, EOMI Neck:  Supple, No adenopathy Chest:  CTA CV:  RRR without murmur or rub.  Radial and DP pulses normal and equal. LE:  No edema.   Assessment & Plan  1.  DM:  Increase Metformin to 1000 mg twice daily and get back to work on lifestyle changes.  Discussed changes needed at  length and to make goals.  2.  Hypertension:  Feels he can make lifestyle changes to bring bp down without additional medication.    3.  Hyperlipidemia:  As in #2.  Continue Atorvastatin  4.  OSA:  Referral to sleep clinic.    5.  HM:  Pneumococcal 23.  Encouraged COVID vaccination when available to him.

## 2019-08-10 ENCOUNTER — Other Ambulatory Visit (HOSPITAL_COMMUNITY)
Admission: RE | Admit: 2019-08-10 | Discharge: 2019-08-10 | Disposition: A | Discharge status: Home / Self Care | Payer: 59 | Source: Ambulatory Visit | Attending: Internal Medicine | Admitting: Internal Medicine

## 2019-08-10 DIAGNOSIS — Z01812 Encounter for preprocedural laboratory examination: Secondary | ICD-10-CM | POA: Insufficient documentation

## 2019-08-10 DIAGNOSIS — Z20822 Contact with and (suspected) exposure to covid-19: Secondary | ICD-10-CM | POA: Diagnosis not present

## 2019-08-10 LAB — SARS CORONAVIRUS 2 (TAT 6-24 HRS): SARS Coronavirus 2: NEGATIVE

## 2019-08-12 ENCOUNTER — Other Ambulatory Visit: Payer: Self-pay | Admitting: Internal Medicine

## 2019-08-12 ENCOUNTER — Ambulatory Visit (HOSPITAL_BASED_OUTPATIENT_CLINIC_OR_DEPARTMENT_OTHER): Payer: 59 | Attending: Internal Medicine | Admitting: Internal Medicine

## 2019-08-12 ENCOUNTER — Other Ambulatory Visit: Payer: Self-pay

## 2019-08-12 DIAGNOSIS — G4733 Obstructive sleep apnea (adult) (pediatric): Secondary | ICD-10-CM

## 2019-08-12 NOTE — Progress Notes (Signed)
Needs order changed to split night sleep study

## 2019-08-13 ENCOUNTER — Other Ambulatory Visit (HOSPITAL_BASED_OUTPATIENT_CLINIC_OR_DEPARTMENT_OTHER): Payer: Self-pay

## 2019-08-13 DIAGNOSIS — G4733 Obstructive sleep apnea (adult) (pediatric): Secondary | ICD-10-CM

## 2019-08-14 ENCOUNTER — Other Ambulatory Visit: Payer: Self-pay

## 2019-08-14 MED ORDER — AMLODIPINE BESYLATE 10 MG PO TABS: 10.0000 mg | ORAL_TABLET | Freq: Every day | ORAL | 3 refills | Status: DC

## 2019-08-14 MED ORDER — METFORMIN HCL 1000 MG PO TABS: ORAL_TABLET | ORAL | 3 refills | Status: DC

## 2019-08-18 DIAGNOSIS — G4733 Obstructive sleep apnea (adult) (pediatric): Secondary | ICD-10-CM | POA: Diagnosis not present

## 2019-08-18 NOTE — Procedures (Signed)
Patient Name: Christopher Schultz, Christopher Schultz Date: 08/12/2019 Gender: Male D.O.B: 1970/10/30 Age (years): 48 Referring Provider: Mack Hook Height (inches): 72 Interpreting Physician: Baird Lyons MD, ABSM Weight (lbs): 309 RPSGT: Carolin Coy BMI: 42 MRN: [id number] Neck Size: 19.00  CLINICAL INFORMATION Sleep Study Type: Split Night CPAP Indication for sleep study: Diabetes, Hypertension, Obesity, OSA, Snoring Epworth Sleepiness Score: 13  SLEEP STUDY TECHNIQUE As per the AASM Manual for the Scoring of Sleep and Associated Events v2.3 (April 2016) with a hypopnea requiring 4% desaturations.  The channels recorded and monitored were frontal, central and occipital EEG, electrooculogram (EOG), submentalis EMG (chin), nasal and oral airflow, thoracic and abdominal wall motion, anterior tibialis EMG, snore microphone, electrocardiogram, and pulse oximetry. Continuous positive airway pressure (CPAP) was initiated when the patient met split night criteria and was titrated according to treat sleep-disordered breathing.  MEDICATIONS Medications self-administered by patient taken the night of the study : none reported  RESPIRATORY PARAMETERS Diagnostic  Total AHI (/hr): 99.2 RDI (/hr): 99.2 OA Index (/hr): 24.5 CA Index (/hr): 0.0 REM AHI (/hr): 102.9 NREM AHI (/hr): 99.2 Supine AHI (/hr): 98.6 Non-supine AHI (/hr): 100.4 Min O2 Sat (%): 86.0 Mean O2 (%): 94.4 Time below 88% (min): 2.3   Titration  Optimal Pressure (cm): 20 AHI at Optimal Pressure (/hr): 0.0 Min O2 at Optimal Pressure (%): 95.0 Supine % at Optimal (%): 100 Sleep % at Optimal (%): 100   SLEEP ARCHITECTURE The recording time for the entire night was 373.7 minutes.  During a baseline period of 175.0 minutes, the patient slept for 146.9 minutes in REM and nonREM, yielding a sleep efficiency of 83.9%%. Sleep onset after lights out was 5.1 minutes with a REM latency of 99.0 minutes. The patient spent 47.6%% of the  night in stage N1 sleep, 50.0%% in stage N2 sleep, 0.0%% in stage N3 and 2.4% in REM.  During the titration period of 196.9 minutes, the patient slept for 181.0 minutes in REM and nonREM, yielding a sleep efficiency of 91.9%%. Sleep onset after CPAP initiation was 9.3 minutes with a REM latency of 62.0 minutes. The patient spent 10.8%% of the night in stage N1 sleep, 57.5%% in stage N2 sleep, 0.0%% in stage N3 and 31.8% in REM.  CARDIAC DATA The 2 lead EKG demonstrated sinus rhythm. The mean heart rate was 100.0 beats per minute. Other EKG findings include: None.  LEG MOVEMENT DATA The total Periodic Limb Movements of Sleep (PLMS) were 0. The PLMS index was 0.0 .  IMPRESSIONS - Severe obstructive sleep apnea occurred during the diagnostic portion of the study (AHI = 99.2/hour). An optimal PAP pressure was selected for this patient ( 20 cm of water) - No significant central sleep apnea occurred during the diagnostic portion of the study (CAI = 0.0/hour). - Moderate oxygen desaturation was noted during the diagnostic portion of the study (Min O2 =86.0%). Min sat on CPAP 20 was 95%. - The patient snored with loud snoring volume during the diagnostic portion of the study. - No cardiac abnormalities were noted during this study. - Clinically significant periodic limb movements did not occur during sleep.  DIAGNOSIS - Obstructive Sleep Apnea (327.23 [G47.33 ICD-10])  RECOMMENDATIONS - Suggest autopap 15-20 or fixed CPAP therapy on 20 cm H2O. - Patient used a Medium size Fisher&Paykel Full Face Mask Simplus mask and heated humidification. - Be careful with alcohol, sedatives and other CNS depressants that may worsen sleep apnea and disrupt normal sleep architecture. - Sleep hygiene should  be reviewed to assess factors that may improve sleep quality. - Weight management and regular exercise should be initiated or continued.  [Electronically signed] 08/18/2019 11:18 AM  Baird Lyons MD,  ABSM Diplomate, American Board of Sleep Medicine   NPI: [id number]                        [place]  ELECTRONICALLY SIGNED ON:  08/18/2019, 11:13 AM [place] PH: [phone]   FX: [phone] [place]

## 2019-08-20 ENCOUNTER — Ambulatory Visit: Payer: 59 | Admitting: Internal Medicine

## 2019-08-21 ENCOUNTER — Other Ambulatory Visit: Payer: Self-pay | Admitting: Internal Medicine

## 2019-08-21 DIAGNOSIS — G4733 Obstructive sleep apnea (adult) (pediatric): Secondary | ICD-10-CM

## 2019-08-21 NOTE — Progress Notes (Unsigned)
Suggest autopap 15-20 or fixed CPAP therapy on 20 cm H2O.  - Patient used a Medium size Fisher&Paykel Full Face Mask Simplus  mask and heated humidification.

## 2019-09-06 ENCOUNTER — Encounter: Payer: Self-pay | Admitting: Internal Medicine

## 2019-09-06 ENCOUNTER — Ambulatory Visit: Payer: 59 | Admitting: Internal Medicine

## 2019-09-06 ENCOUNTER — Other Ambulatory Visit: Payer: Self-pay

## 2019-09-06 VITALS — BP 136/88 | HR 74 | Resp 12 | Ht 70.5 in | Wt 308.0 lb

## 2019-09-06 DIAGNOSIS — G4733 Obstructive sleep apnea (adult) (pediatric): Secondary | ICD-10-CM

## 2019-09-06 DIAGNOSIS — B351 Tinea unguium: Secondary | ICD-10-CM | POA: Insufficient documentation

## 2019-09-06 DIAGNOSIS — M79672 Pain in left foot: Secondary | ICD-10-CM

## 2019-09-06 DIAGNOSIS — B353 Tinea pedis: Secondary | ICD-10-CM

## 2019-09-06 MED ORDER — TERBINAFINE HCL 250 MG PO TABS: ORAL_TABLET | ORAL | 0 refills | Status: DC

## 2019-09-06 NOTE — Progress Notes (Signed)
    Subjective:    Patient ID: Christopher Schultz, male   DOB: [birth year], 49 y.o.   MRN: [id number]   HPI   Soreness of dorsal left foot for about 6 months.  No history of injury.  Thought perhaps due to shoes, so purchased expensive shoes--Closed toed Birkenstocks, but continues.  He is on hard floors all day long with work.  States a lot of pain after off his feet and then the pain is worse.  After moving around, the soreness is better.   Lot of soreness first thing getting out of bed in the morning.   Current Meds  Medication Sig  . amLODipine (NORVASC) 10 MG tablet Take 1 tablet (10 mg total) by mouth daily.  Marland Kitchen atorvastatin (LIPITOR) 40 MG tablet Take 1 tablet (40 mg total) by mouth daily.  . cloNIDine (CATAPRES) 0.1 MG tablet Take 2 tablets (0.2 mg total) by mouth 2 (two) times daily.  Marland Kitchen glipiZIDE (GLUCOTROL) 5 MG tablet Take 1 tablet (5 mg total) by mouth 2 (two) times daily before a meal.  . losartan (COZAAR) 100 MG tablet Take 1 tablet (100 mg total) by mouth daily.  . metFORMIN (GLUCOPHAGE) 1000 MG tablet 1 tab by mouth twice daily with meals   No Known Allergies   Review of Systems    Objective:   BP 136/88 (BP Location: Left Arm, Patient Position: Sitting, Cuff Size: Large)   Pulse 74   Resp 12   Ht 5' 10.5" (1.791 m)   Wt (!) 308 lb (139.7 kg)   BMI 43.57 kg/m   Physical Exam  Bilateral feet with significant flaking and peeling.  All nails thickened, deformed and discolored Tender over dorsal MT length of 4th and 5th rays.  Nontender over lateral base of 5th MT  No erythema or obvious swelling.  No crepitation   Assessment & Plan   1.  Left lateral dorsal foot soreness and tenderness:  Podiatry referral.   Xrays at [facility]. Continue supportive footwear  2.  Toenail onychomycosis and tinea pedis:  Terbinafine 250 mg daily for 84 days.  Shower floor and footwear treatment discussed.  3.  OSA:  Order for CPAP finally able to place and faxed to  [facility].

## 2019-09-06 NOTE — Patient Instructions (Signed)
Mix [place] foot cream with Tea tree oil and massage into feet after shower daily.

## 2019-09-16 ENCOUNTER — Telehealth: Payer: Self-pay | Admitting: Internal Medicine

## 2019-09-16 NOTE — Telephone Encounter (Signed)
Patient called stating foot soreness and tenderness is worst than before when he came to be seen for for it on 09/06/2019. Patient stating has podiatry appointment scheduled for 09/30/2019 but patient is  requesting a call back from Cherice to get advise or  something can be done before that appointment.

## 2019-09-17 ENCOUNTER — Ambulatory Visit
Admission: RE | Admit: 2019-09-17 | Discharge: 2019-09-17 | Disposition: A | Discharge status: Home / Self Care | Payer: 59 | Source: Ambulatory Visit | Attending: Internal Medicine | Admitting: Internal Medicine

## 2019-09-17 DIAGNOSIS — M79672 Pain in left foot: Secondary | ICD-10-CM

## 2019-09-18 ENCOUNTER — Telehealth: Payer: Self-pay

## 2019-09-18 MED ORDER — DICLOFENAC SODIUM 1.5 % EX SOLN: 2.0000 g | Freq: Four times a day (QID) | CUTANEOUS | 2 refills | Status: DC | PRN

## 2019-09-18 NOTE — Telephone Encounter (Signed)
Spoke with patient yesterday. Patient had not gone to get X-Ray of foot done. Patient stated he needed appointment for that. Informed him it is a walk in service. Patient states he will get it done.  Patient called back to inform he had X-Ray done. Results had not come back by last night. Dr. Delrae Alfred sent patient a my chart message giving results.

## 2019-09-18 NOTE — Telephone Encounter (Signed)
error 

## 2019-09-18 NOTE — Addendum Note (Signed)
Addended by: Marcene Duos on: 09/18/2019 09:25 AM   Modules accepted: Orders

## 2019-09-19 NOTE — Progress Notes (Signed)
Order along with demographics, insurance and OV notes faxed to [facility] @ [phone]

## 2019-09-30 ENCOUNTER — Ambulatory Visit: Payer: Self-pay | Admitting: Podiatry

## 2019-10-21 ENCOUNTER — Other Ambulatory Visit: Payer: 59

## 2019-10-22 ENCOUNTER — Other Ambulatory Visit: Payer: 59

## 2019-10-23 ENCOUNTER — Ambulatory Visit: Payer: 59 | Admitting: Internal Medicine

## 2019-10-24 ENCOUNTER — Other Ambulatory Visit: Payer: 59

## 2019-10-31 ENCOUNTER — Other Ambulatory Visit: Payer: 59

## 2019-11-04 ENCOUNTER — Other Ambulatory Visit (INDEPENDENT_AMBULATORY_CARE_PROVIDER_SITE_OTHER): Payer: Self-pay

## 2019-11-04 DIAGNOSIS — Z79899 Other long term (current) drug therapy: Secondary | ICD-10-CM

## 2019-11-04 DIAGNOSIS — E1165 Type 2 diabetes mellitus with hyperglycemia: Secondary | ICD-10-CM

## 2019-11-04 DIAGNOSIS — E782 Mixed hyperlipidemia: Secondary | ICD-10-CM

## 2019-11-05 ENCOUNTER — Ambulatory Visit: Payer: 59 | Admitting: Podiatry

## 2019-11-05 ENCOUNTER — Ambulatory Visit: Payer: Self-pay | Admitting: Internal Medicine

## 2019-11-05 ENCOUNTER — Encounter: Payer: Self-pay | Admitting: Internal Medicine

## 2019-11-05 VITALS — BP 130/88 | HR 72 | Resp 12 | Ht 70.5 in | Wt 332.0 lb

## 2019-11-05 DIAGNOSIS — B353 Tinea pedis: Secondary | ICD-10-CM

## 2019-11-05 DIAGNOSIS — Z6841 Body Mass Index (BMI) 40.0 and over, adult: Secondary | ICD-10-CM

## 2019-11-05 DIAGNOSIS — G4733 Obstructive sleep apnea (adult) (pediatric): Secondary | ICD-10-CM

## 2019-11-05 DIAGNOSIS — B351 Tinea unguium: Secondary | ICD-10-CM

## 2019-11-05 DIAGNOSIS — E1165 Type 2 diabetes mellitus with hyperglycemia: Secondary | ICD-10-CM

## 2019-11-05 DIAGNOSIS — I10 Essential (primary) hypertension: Secondary | ICD-10-CM

## 2019-11-05 LAB — COMPREHENSIVE METABOLIC PANEL
ALT: 20 IU/L (ref 0–44)
AST: 15 IU/L (ref 0–40)
Albumin/Globulin Ratio: 1.7 (ref 1.2–2.2)
Albumin: 4.5 g/dL (ref 4.0–5.0)
Alkaline Phosphatase: 83 IU/L (ref 48–121)
BUN/Creatinine Ratio: 14 (ref 9–20)
BUN: 16 mg/dL (ref 6–24)
Bilirubin Total: 0.2 mg/dL (ref 0.0–1.2)
CO2: 22 mmol/L (ref 20–29)
Calcium: 9.2 mg/dL (ref 8.7–10.2)
Chloride: 105 mmol/L (ref 96–106)
Creatinine, Ser: 1.11 mg/dL (ref 0.76–1.27)
GFR calc Af Amer: 91 mL/min/{1.73_m2} (ref >59)
GFR calc non Af Amer: 79 mL/min/{1.73_m2} (ref >59)
Globulin, Total: 2.7 g/dL (ref 1.5–4.5)
Glucose: 86 mg/dL (ref 65–99)
Potassium: 4.8 mmol/L (ref 3.5–5.2)
Sodium: 141 mmol/L (ref 134–144)
Total Protein: 7.2 g/dL (ref 6.0–8.5)

## 2019-11-05 LAB — MICROALBUMIN / CREATININE URINE RATIO
Creatinine, Urine: 148.9 mg/dL
Microalb/Creat Ratio: 16 mg/g creat (ref 0–29)
Microalbumin, Urine: 24.4 ug/mL

## 2019-11-05 LAB — LIPID PANEL W/O CHOL/HDL RATIO
Cholesterol, Total: 201 mg/dL — ABNORMAL HIGH (ref 100–199)
HDL: 34 mg/dL — ABNORMAL LOW (ref >39)
LDL Chol Calc (NIH): 139 mg/dL — ABNORMAL HIGH (ref 0–99)
Triglycerides: 153 mg/dL — ABNORMAL HIGH (ref 0–149)
VLDL Cholesterol Cal: 28 mg/dL (ref 5–40)

## 2019-11-05 LAB — HEPATIC FUNCTION PANEL: Bilirubin, Direct: 0.08 mg/dL (ref 0.00–0.40)

## 2019-11-05 LAB — HGB A1C W/O EAG: Hgb A1c MFr Bld: 7.1 % — ABNORMAL HIGH (ref 4.8–5.6)

## 2019-11-05 MED ORDER — TERBINAFINE HCL 250 MG PO TABS: ORAL_TABLET | ORAL | 0 refills | Status: DC

## 2019-11-05 NOTE — Patient Instructions (Signed)
Drink a glass of water before every meal Drink 6-8 glasses of water daily Eat three meals daily Eat a protein and healthy fat with every meal (eggs,fish, chicken, [place] and limit red meats) Eat 5 servings of vegetables daily, mix the colors Eat 2 servings of fruit daily with skin, if skin is edible Use smaller plates Put food/utensils down as you chew and swallow each bite Eat at a table with friends/family at least once daily, no TV Do not eat in front of the TV  Recent studies show that people who consume all of their calories in a 12 hour period lose weight more efficiently.  For example, if you eat your first meal at 7:00 a.m., your last meal of the day should be completed by 7:00 p.m.  For foot and toenail fungus:  Spray your shoes with Lysol or Lotrimin Antifungal spray when you start the medication for your feet.   Re-spray your the shoes you wear with each wearing and allow to dry before wearing again You will need to continue to spray the shoes you have during the infection of your toenails and feet have been thrown out due to wear.  Once your toenails and feet are clear of infection, the shoes you buy new do not necessarily need to be sprayed. Clean your shower floor once to twice daily with bleach containing cleaner.

## 2019-11-05 NOTE — Progress Notes (Signed)
    Subjective:    Patient ID: Christopher Schultz, male   DOB: [birth year], 48 y.o.   MRN: [id number]   HPI   1.  DM:  A1C down to 7.1% from 11.9%.  However, has gained 24 lbs since last seen.    First eats at noon--carrots and celery  9 pm:  [place] burgers, potato chips--lots of chips, and vegetable.  Does eat salads.    Drinks a lot of grape juice.  [place] Hi C  Does walk every other day.  Does have a new bike  2.  Hyperlipidemia:  Was out of Atorvastatin 40 mg for a week recently.  He does not feel he misses often.  Levels much worse than in December Lipid Panel     Component Value Date/Time   CHOL 201 (H) 11/04/2019 0946   TRIG 153 (H) 11/04/2019 0946   HDL 34 (L) 11/04/2019 0946   CHOLHDL 5.7 08/12/2017 0845   VLDL 17 08/12/2017 0845   LDLCALC 139 (H) 11/04/2019 0946   LABVLDL 28 11/04/2019 0946     3.  Hypertension:  Wants to remeasure.  When he heard his weight, was very stressed.  4.  OSA:  Has not picked up CPAP equipment yet.    5.  Toenail onychomycosis:  [facility] did not have the Rx for Terbinafine in April so he has not started.    Current Meds  Medication Sig  . amLODipine (NORVASC) 10 MG tablet Take 1 tablet (10 mg total) by mouth daily.  Marland Kitchen atorvastatin (LIPITOR) 40 MG tablet Take 1 tablet (40 mg total) by mouth daily.  . cloNIDine (CATAPRES) 0.1 MG tablet Take 2 tablets (0.2 mg total) by mouth 2 (two) times daily.  Marland Kitchen glipiZIDE (GLUCOTROL) 5 MG tablet Take 1 tablet (5 mg total) by mouth 2 (two) times daily before a meal.  . losartan (COZAAR) 100 MG tablet Take 1 tablet (100 mg total) by mouth daily.  . metFORMIN (GLUCOPHAGE) 1000 MG tablet 1 tab by mouth twice daily with meals   No Known Allergies   Review of Systems    Objective:   BP (!) 132/92 (BP Location: Left Arm, Patient Position: Sitting, Cuff Size: Large)   Pulse 72   Resp 12   Ht 5' 10.5" (1.791 m)   Wt (!) 332 lb (150.6 kg)   BMI 46.96 kg/m   Physical Exam  Lungs:  CTA CV:  RRR  without murmur or rub.  Radial pulses normal and equal.   LE:   without edema Refused foot exam    Assessment & Plan  1.  DM:  Much improved control, almost at goal, however, significant weight gain.  Discussed at length goals with physical activity and eating--both what he eats and behavior with eating.  2.  Hyperlipidemia:  Suspect he is missing meds more than he realizes.  Continue Atorvastatin and lifestyle changes as above.  3.  OSA:  Has not picked up CPAP equipment yet.  To get it ASAP.  Discussed how this could affect heart and weight as well.  4.  Toenail onychomycosis:  Resent Terbinafine Rx.  Discussed needs to call if unable to obtain meds in future.  5.  Hypertension:  Stressed over significant weight gain.  Improved a bit after calmed.  6.  COVID--received first vaccine yesterday--[employer]izer

## 2019-12-24 ENCOUNTER — Telehealth (INDEPENDENT_AMBULATORY_CARE_PROVIDER_SITE_OTHER): Payer: Self-pay | Admitting: Internal Medicine

## 2019-12-24 DIAGNOSIS — Z6841 Body Mass Index (BMI) 40.0 and over, adult: Secondary | ICD-10-CM

## 2019-12-24 DIAGNOSIS — B351 Tinea unguium: Secondary | ICD-10-CM

## 2019-12-24 DIAGNOSIS — I10 Essential (primary) hypertension: Secondary | ICD-10-CM

## 2019-12-24 DIAGNOSIS — E1165 Type 2 diabetes mellitus with hyperglycemia: Secondary | ICD-10-CM

## 2019-12-24 DIAGNOSIS — E782 Mixed hyperlipidemia: Secondary | ICD-10-CM

## 2019-12-24 DIAGNOSIS — M79672 Pain in left foot: Secondary | ICD-10-CM

## 2019-12-24 DIAGNOSIS — B353 Tinea pedis: Secondary | ICD-10-CM

## 2019-12-24 NOTE — Progress Notes (Signed)
    Subjective:    Patient ID: Christopher Schultz, male   DOB: [birth year], 49 y.o.   MRN: [id number]   HPI   Virtual visit via [employer]  1.  Toenail onychomycosis:  Terbinafine 250 mg daily.  Tolerating fine.  He is at the 6 week mark.  He cannot say whether he notes any change.   He is treating shoes and cleaning shower floor.  2.  Weight gain:  Has lost 11 lbs.  He is walking.  Not eating so late.    3.  Hyperlipidemia:  Weight loss and physical activity as above.    4.  Hypertension:  Will check bp on Thursday with labs.   5.  DM:  Not checking sugars.    6.  Soreness in left foot at plantar surface of heel and dorsal MTP joint area.  No redness or swelling.  He missed two podiatry appts.  He has inserts in shoes and has not helped.     Current Meds  Medication Sig  . amLODipine (NORVASC) 10 MG tablet Take 1 tablet (10 mg total) by mouth daily.  Marland Kitchen atorvastatin (LIPITOR) 40 MG tablet Take 1 tablet (40 mg total) by mouth daily.  . cloNIDine (CATAPRES) 0.1 MG tablet Take 2 tablets (0.2 mg total) by mouth 2 (two) times daily.  Marland Kitchen glipiZIDE (GLUCOTROL) 5 MG tablet Take 1 tablet (5 mg total) by mouth 2 (two) times daily before a meal.  . losartan (COZAAR) 100 MG tablet Take 1 tablet (100 mg total) by mouth daily.  . metFORMIN (GLUCOPHAGE) 1000 MG tablet 1 tab by mouth twice daily with meals  . terbinafine (LAMISIL) 250 MG tablet 1 tab by mouth daily for 84 days.   No Known Allergies   Review of Systems    Objective:   There were no vitals taken for this visit.  Physical Exam NAD Not willing to show me his toenails in office at work.  Assessment & Plan   1.  Onychomycosis:  Will follow up in 6 weeks.  Tolerating Terbinafine.  Hepatic profile this Thursday.  2.  Hypertension:  BP check with hepatic profile labs this Thursday  3.  DM/hyperlipidemia:  Has made significant changes with lifestyle with resulting weight loss.  4.  Left foot pain:  Will look into sending him to a  different podiatrist as has missed 2 visits with previous specialist.  States he was not aware of any appt.

## 2019-12-26 ENCOUNTER — Other Ambulatory Visit: Payer: Self-pay

## 2019-12-26 ENCOUNTER — Other Ambulatory Visit (INDEPENDENT_AMBULATORY_CARE_PROVIDER_SITE_OTHER): Payer: Self-pay

## 2019-12-26 VITALS — BP 124/88 | HR 74

## 2019-12-26 DIAGNOSIS — Z79899 Other long term (current) drug therapy: Secondary | ICD-10-CM

## 2019-12-26 NOTE — Progress Notes (Signed)
Patient BP in normal range. Informed to continue current dose of medication 

## 2019-12-27 LAB — HEPATIC FUNCTION PANEL
ALT: 19 IU/L (ref 0–44)
AST: 12 IU/L (ref 0–40)
Albumin: 4.5 g/dL (ref 4.0–5.0)
Alkaline Phosphatase: 79 IU/L (ref 48–121)
Bilirubin Total: <0.2 mg/dL (ref 0.0–1.2)
Bilirubin, Direct: 0.09 mg/dL (ref 0.00–0.40)
Total Protein: 6.9 g/dL (ref 6.0–8.5)

## 2020-01-08 ENCOUNTER — Other Ambulatory Visit: Payer: Self-pay

## 2020-01-08 ENCOUNTER — Ambulatory Visit: Payer: No Typology Code available for payment source | Admitting: Podiatry

## 2020-01-08 ENCOUNTER — Ambulatory Visit (INDEPENDENT_AMBULATORY_CARE_PROVIDER_SITE_OTHER): Payer: No Typology Code available for payment source

## 2020-01-08 DIAGNOSIS — M79672 Pain in left foot: Secondary | ICD-10-CM

## 2020-01-08 DIAGNOSIS — M7752 Other enthesopathy of left foot: Secondary | ICD-10-CM | POA: Diagnosis not present

## 2020-01-08 DIAGNOSIS — M722 Plantar fascial fibromatosis: Secondary | ICD-10-CM

## 2020-01-09 ENCOUNTER — Encounter: Payer: Self-pay | Admitting: Podiatry

## 2020-01-09 NOTE — Progress Notes (Signed)
Subjective:  Patient ID: Christopher Schultz, male    DOB: [birth year],  MRN: [id number]  Chief Complaint  Patient presents with  . Foot Pain    L forefoot (dorsal/plantar), L dorsal midfoot, and bottom of L heel. Pt stated, "It's been going on for a long time. I think it's because I walk on concrete. I walk on concrete floors at work. Pain = 4/10 at work, but it gets a lot worse when I stand after sitting. I bought new shoes, gel inserts, and custom inserts".    49 y.o. male presents with the above complaint. Patient presents with complaint of left heel pain as well as left forefoot pain at the level of the third metatarsophalangeal joint.  Patient states is painful to walk on.  Is been going on for months at a time.  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  Patient states his pain scale is 4 out of 10 is not as severe today but it has again very severe when he is on his foot.  He states the pain is worse when getting up in the morning and taken the first step for the heel pain and progressively gets worse in the ball of the foot.  He does not recall which one came first he thinks it may be the ball of the foot as opposed to the heel.  He states that he is tired shoes gel inserts some custom inserts but none of that has helped.  He denies any other acute complaints   Review of Systems: Negative except as noted in the HPI. Denies N/V/F/Ch.  Past Medical History:  Diagnosis Date  . Hernia, abdominal   . Hypertension 2019  . Morbid obesity with BMI of 40.0-44.9, adult ([place])   . Morbid obesity with BMI of 40.0-44.9, adult ([place])   . OSA (obstructive sleep apnea) 2004  . OSA on CPAP   . Pre-diabetes   . Prediabetes 05/30/2017    Current Outpatient Medications:  .  amLODipine (NORVASC) 10 MG tablet, Take 1 tablet (10 mg total) by mouth daily., Disp: 90 tablet, Rfl: 3 .  atorvastatin (LIPITOR) 40 MG tablet, Take 1 tablet (40 mg total) by mouth daily., Disp: 30 tablet, Rfl: 11 .  cloNIDine  (CATAPRES) 0.1 MG tablet, Take 2 tablets (0.2 mg total) by mouth 2 (two) times daily., Disp: 120 tablet, Rfl: 11 .  glipiZIDE (GLUCOTROL) 5 MG tablet, Take 1 tablet (5 mg total) by mouth 2 (two) times daily before a meal., Disp: 60 tablet, Rfl: 11 .  ID NOW COVID-19 KIT, See admin instructions., Disp: , Rfl:  .  losartan (COZAAR) 100 MG tablet, Take 1 tablet (100 mg total) by mouth daily., Disp: 30 tablet, Rfl: 11 .  metFORMIN (GLUCOPHAGE) 1000 MG tablet, 1 tab by mouth twice daily with meals, Disp: 180 tablet, Rfl: 3 .  terbinafine (LAMISIL) 250 MG tablet, 1 tab by mouth daily for 84 days., Disp: 84 tablet, Rfl: 0 .  Diclofenac Sodium 1.5 % SOLN, Apply 2 g topically 4 (four) times daily as needed. (Patient not taking: Reported on 01/08/2020), Disp: 300 mL, Rfl: 2  Social History   Tobacco Use  Smoking Status Never Smoker  Smokeless Tobacco Never Used    No Known Allergies Objective:  There were no vitals filed for this visit. There is no height or weight on file to calculate BMI. Constitutional Well developed. Well nourished.  Vascular Dorsalis pedis pulses palpable bilaterally. Posterior tibial pulses palpable bilaterally.  Capillary refill normal to all digits.  No cyanosis or clubbing noted. Pedal hair growth normal.  Neurologic Normal speech. Oriented to person, place, and time. Epicritic sensation to light touch grossly present bilaterally.  Dermatologic Nails well groomed and normal in appearance. No open wounds. No skin lesions.  Orthopedic: Normal joint ROM without pain or crepitus bilaterally. No visible deformities. Tender to palpation at the calcaneal tuber left. No pain with calcaneal squeeze left. Ankle ROM diminished range of motion left. Silfverskiold Test: positive left.  Left third metatarsophalangeal joint pain intra-articular noted.  Pain with range of motion of the of the third MTPJ joint.  No pain with resisted dorsiflexion as well as plantarflexion of the  digits.   Radiographs: Taken and reviewed. No acute fractures or dislocations. No evidence of stress fracture.  Plantar heel spur present. Posterior heel spur present.   Assessment:   1. Left foot pain   2. Plantar fasciitis of left foot   3. Capsulitis of metatarsophalangeal (MTP) joint of left foot    Plan:  Patient was evaluated and treated and all questions answered.  Left  MTPJ joint capsulitis -I explained to the patient the etiology of third MTPJ capsulitis and various treatment options were extensively discussed with the patient.  I believe patient will benefit from a steroid injection to help decrease acute inflammatory component associated pain.  Patient agrees with the plan would like to proceed with a steroid injection. -A steroid injection was performed at left third MTPJ using 1% plain Lidocaine and 10 mg of Kenalog. This was well tolerated.  Plantar Fasciitis, left - XR reviewed as above.  - Educated on icing and stretching. Instructions given.  - Injection delivered to the plantar fascia as below. - DME: Plantar Fascial Brace - Pharmacologic management: Meloxicam/Medrol Dose Pak. Educated on risks/benefits and proper taking of medication.  Procedure: Injection Tendon/Ligament Location: Left plantar fascia at the glabrous junction; medial approach. Skin Prep: alcohol Injectate: 0.5 cc 0.5% marcaine plain, 0.5 cc of 1% Lidocaine, 0.5 cc kenalog 10. Disposition: Patient tolerated procedure well. Injection site dressed with a band-aid.  No follow-ups on file.

## 2020-01-11 ENCOUNTER — Other Ambulatory Visit: Payer: Self-pay | Admitting: Podiatry

## 2020-02-04 ENCOUNTER — Other Ambulatory Visit (INDEPENDENT_AMBULATORY_CARE_PROVIDER_SITE_OTHER): Payer: Self-pay

## 2020-02-04 DIAGNOSIS — E782 Mixed hyperlipidemia: Secondary | ICD-10-CM

## 2020-02-04 DIAGNOSIS — Z79899 Other long term (current) drug therapy: Secondary | ICD-10-CM

## 2020-02-04 DIAGNOSIS — E1165 Type 2 diabetes mellitus with hyperglycemia: Secondary | ICD-10-CM

## 2020-02-05 ENCOUNTER — Ambulatory Visit: Payer: No Typology Code available for payment source | Admitting: Podiatry

## 2020-02-05 LAB — COMPREHENSIVE METABOLIC PANEL
ALT: 19 IU/L (ref 0–44)
AST: 17 IU/L (ref 0–40)
Albumin/Globulin Ratio: 1.8 (ref 1.2–2.2)
Albumin: 4.3 g/dL (ref 4.0–5.0)
Alkaline Phosphatase: 77 IU/L (ref 48–121)
BUN/Creatinine Ratio: 10 (ref 9–20)
BUN: 11 mg/dL (ref 6–24)
Bilirubin Total: 0.2 mg/dL (ref 0.0–1.2)
CO2: 25 mmol/L (ref 20–29)
Calcium: 9.4 mg/dL (ref 8.7–10.2)
Chloride: 106 mmol/L (ref 96–106)
Creatinine, Ser: 1.09 mg/dL (ref 0.76–1.27)
GFR calc Af Amer: 93 mL/min/{1.73_m2} (ref >59)
GFR calc non Af Amer: 80 mL/min/{1.73_m2} (ref >59)
Globulin, Total: 2.4 g/dL (ref 1.5–4.5)
Glucose: 80 mg/dL (ref 65–99)
Potassium: 4.7 mmol/L (ref 3.5–5.2)
Sodium: 143 mmol/L (ref 134–144)
Total Protein: 6.7 g/dL (ref 6.0–8.5)

## 2020-02-05 LAB — LIPID PANEL W/O CHOL/HDL RATIO
Cholesterol, Total: 127 mg/dL (ref 100–199)
HDL: 36 mg/dL — ABNORMAL LOW (ref >39)
LDL Chol Calc (NIH): 72 mg/dL (ref 0–99)
Triglycerides: 104 mg/dL (ref 0–149)
VLDL Cholesterol Cal: 19 mg/dL (ref 5–40)

## 2020-02-05 LAB — HGB A1C W/O EAG: Hgb A1c MFr Bld: 6.4 % — ABNORMAL HIGH (ref 4.8–5.6)

## 2020-02-11 ENCOUNTER — Ambulatory Visit: Payer: No Typology Code available for payment source | Admitting: Internal Medicine

## 2020-03-06 ENCOUNTER — Ambulatory Visit: Payer: No Typology Code available for payment source | Admitting: Podiatry

## 2020-03-06 ENCOUNTER — Encounter: Payer: Self-pay | Admitting: Podiatry

## 2020-03-06 ENCOUNTER — Other Ambulatory Visit: Payer: Self-pay

## 2020-03-06 DIAGNOSIS — M216X2 Other acquired deformities of left foot: Secondary | ICD-10-CM | POA: Diagnosis not present

## 2020-03-06 DIAGNOSIS — M21862 Other specified acquired deformities of left lower leg: Secondary | ICD-10-CM

## 2020-03-06 DIAGNOSIS — M722 Plantar fascial fibromatosis: Secondary | ICD-10-CM

## 2020-03-06 NOTE — Progress Notes (Signed)
Subjective:  Patient ID: Christopher Schultz, male    DOB: [birth year],  MRN: [id number]  Chief Complaint  Patient presents with  . Foot Pain    Left foot pain. Pt stated that injection did not help and his foot is still very sore     49 y.o. male presents with the above complaint. Patient presents with complaint of left heel pain as well as left forefoot pain at the level of the third metatarsophalangeal joint.  Patient states is painful to walk on.  Is been going on for months at a time.  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  Patient states his pain scale is 4 out of 10 is not as severe today but it has again very severe when he is on his foot.  He states the pain is worse when getting up in the morning and taken the first step for the heel pain and progressively gets worse in the ball of the foot.  He does not recall which one came first he thinks it may be the ball of the foot as opposed to the heel.  He states that he is tired shoes gel inserts some custom inserts but none of that has helped.  He denies any other acute complaints   Review of Systems: Negative except as noted in the HPI. Denies N/V/F/Ch.  Past Medical History:  Diagnosis Date  . Hernia, abdominal   . Hypertension 2019  . Morbid obesity with BMI of 40.0-44.9, adult ([place])   . Morbid obesity with BMI of 40.0-44.9, adult ([place])   . OSA (obstructive sleep apnea) 2004  . OSA on CPAP   . Pre-diabetes   . Prediabetes 05/30/2017    Current Outpatient Medications:  .  amLODipine (NORVASC) 10 MG tablet, Take 1 tablet (10 mg total) by mouth daily., Disp: 90 tablet, Rfl: 3 .  atorvastatin (LIPITOR) 40 MG tablet, Take 1 tablet (40 mg total) by mouth daily., Disp: 30 tablet, Rfl: 11 .  cloNIDine (CATAPRES) 0.1 MG tablet, Take 2 tablets (0.2 mg total) by mouth 2 (two) times daily., Disp: 120 tablet, Rfl: 11 .  Diclofenac Sodium 1.5 % SOLN, Apply 2 g topically 4 (four) times daily as needed. (Patient not taking: Reported on  01/08/2020), Disp: 300 mL, Rfl: 2 .  glipiZIDE (GLUCOTROL) 5 MG tablet, Take 1 tablet (5 mg total) by mouth 2 (two) times daily before a meal., Disp: 60 tablet, Rfl: 11 .  ID NOW COVID-19 KIT, See admin instructions., Disp: , Rfl:  .  losartan (COZAAR) 100 MG tablet, Take 1 tablet (100 mg total) by mouth daily., Disp: 30 tablet, Rfl: 11 .  metFORMIN (GLUCOPHAGE) 1000 MG tablet, 1 tab by mouth twice daily with meals, Disp: 180 tablet, Rfl: 3 .  terbinafine (LAMISIL) 250 MG tablet, 1 tab by mouth daily for 84 days., Disp: 84 tablet, Rfl: 0  Social History   Tobacco Use  Smoking Status Never Smoker  Smokeless Tobacco Never Used    No Known Allergies Objective:  There were no vitals filed for this visit. There is no height or weight on file to calculate BMI. Constitutional Well developed. Well nourished.  Vascular Dorsalis pedis pulses palpable bilaterally. Posterior tibial pulses palpable bilaterally. Capillary refill normal to all digits.  No cyanosis or clubbing noted. Pedal hair growth normal.  Neurologic Normal speech. Oriented to person, place, and time. Epicritic sensation to light touch grossly present bilaterally.  Dermatologic Nails well groomed and normal in appearance.  No open wounds. No skin lesions.  Orthopedic: Normal joint ROM without pain or crepitus bilaterally. No visible deformities. Tender to palpation at the calcaneal tuber left. No pain with calcaneal squeeze left. Ankle ROM diminished range of motion left. Silfverskiold Test: positive left.  Left third metatarsophalangeal joint pain intra-articular noted.  Pain with range of motion of the of the third MTPJ joint.  No pain with resisted dorsiflexion as well as plantarflexion of the digits.   Radiographs: Taken and reviewed. No acute fractures or dislocations. No evidence of stress fracture.  Plantar heel spur present. Posterior heel spur present.   Assessment:   1. Plantar fasciitis of left foot   2.  Gastrocnemius equinus, left    Plan:  Patient was evaluated and treated and all questions answered.  Left  MTPJ joint capsulitis -Resolving  Plantar Fasciitis, left with underlying gastrocnemius equinus - Patient presents with continued follow-up of left plantar fasciitis and it appears that patient has failed all conservative therapy including injection orthotics.  I believe patient will not benefit from surgical intervention as he does not want to undergo cam boot immobilization as well.  I discussed with him in extensive detail the importance of immobilization he states that he will continue cam boot immobilization for next 4 weeks if there is no improvement he will stick with surgery.  But he would like to go ahead and schedule the surgery.  He will be scheduled for left gastrocnemius recession with endoscopic plantar fasciotomy.  I discussed with the patient extensive detail my postop protocol at my postop incision planning.  Patient states understanding would like to proceed with the surgery. -Cam boot was dispensed -Informed surgical risk consent was reviewed and read aloud to the patient.  I reviewed the films.  I have discussed my findings with the patient in great detail.  I have discussed all risks including but not limited to infection, stiffness, scarring, limp, disability, deformity, damage to blood vessels and nerves, numbness, poor healing, need for braces, arthritis, chronic pain, amputation, death.  All benefits and realistic expectations discussed in great detail.  I have made no promises as to the outcome.  I have provided realistic expectations.  I have offered the patient a 2nd opinion, which they have declined and assured me they preferred to proceed despite the risks -Cam boot was dispensed -A total of 33 minutes was spent in direct patient care as well as pre and post patient encounter activities.  This includes documentation as well as reviewing patient chart for labs, imaging,  past medical, surgical, social, and family history as documented in the EMR.  I have reviewed medication allergies as documented in EMR.  I discussed the etiology of condition and treatment options from conservative to surgical care.  All risks and benefit of the treatment course was discussed in detail.  All questions were answered and return appointment was discussed.  Since the visit completed in an ambulatory/outpatient setting, the patient and/or parent/guardian has been advised to contact the providers office for worsening condition and seek medical treatment and/or call 911 if the patient deems either is necessary.  No follow-ups on file.

## 2020-03-16 ENCOUNTER — Telehealth: Payer: Self-pay

## 2020-03-16 NOTE — Telephone Encounter (Signed)
DOS 04/06/2020  EPF LT - 03491 GASTROCNEMIUS RECESS LT - 79150  [employer]  EFFECTIVE DATE - 04/30/2019  PLAN DEDUCTIBLE - $4000.00 W/ $653.91 REMAINING OUT OF POCKET - $6600.00 W/ $3048.20 REMAINING COPAY $0.00 COINSURANCE - 60%  PER AUTOMATED SYSTEM NO PRECERT IS REQUIRED FOR CPT 56979 OR 48016. CALL REF # [id number]

## 2020-03-28 ENCOUNTER — Other Ambulatory Visit: Payer: Self-pay | Admitting: Internal Medicine

## 2020-04-03 ENCOUNTER — Other Ambulatory Visit: Payer: Self-pay

## 2020-04-03 ENCOUNTER — Ambulatory Visit: Payer: No Typology Code available for payment source | Admitting: Podiatry

## 2020-04-03 DIAGNOSIS — M21862 Other specified acquired deformities of left lower leg: Secondary | ICD-10-CM

## 2020-04-03 DIAGNOSIS — M216X2 Other acquired deformities of left foot: Secondary | ICD-10-CM | POA: Diagnosis not present

## 2020-04-03 DIAGNOSIS — M722 Plantar fascial fibromatosis: Secondary | ICD-10-CM

## 2020-04-03 DIAGNOSIS — Z01818 Encounter for other preprocedural examination: Secondary | ICD-10-CM

## 2020-04-06 ENCOUNTER — Telehealth: Payer: Self-pay | Admitting: Podiatry

## 2020-04-06 DIAGNOSIS — M722 Plantar fascial fibromatosis: Secondary | ICD-10-CM | POA: Diagnosis not present

## 2020-04-06 DIAGNOSIS — M216X2 Other acquired deformities of left foot: Secondary | ICD-10-CM | POA: Diagnosis not present

## 2020-04-06 MED ORDER — OXYCODONE-ACETAMINOPHEN 10-325 MG PO TABS
1.0000 | ORAL_TABLET | ORAL | 0 refills | Status: DC | PRN
Start: 2020-04-06 — End: 2020-04-06

## 2020-04-06 MED ORDER — IBUPROFEN 800 MG PO TABS: 800.0000 mg | ORAL_TABLET | Freq: Four times a day (QID) | ORAL | 1 refills | Status: DC | PRN

## 2020-04-06 MED ORDER — OXYCODONE-ACETAMINOPHEN 10-325 MG PO TABS: 1.0000 | ORAL_TABLET | Freq: Three times a day (TID) | ORAL | 0 refills | Status: AC | PRN

## 2020-04-06 NOTE — Telephone Encounter (Signed)
[facility] pharmacist left VM on the nurse line stating that the dosage of the percocet was too high and could not fill it. I attempted to call back but was on hold and could not speak to them. I called the patient and he did not get he prescription. I sent over percocet 10/325 1 q8h prn to [facility] at [facility] request.

## 2020-04-07 ENCOUNTER — Encounter: Payer: Self-pay | Admitting: Podiatry

## 2020-04-07 NOTE — Progress Notes (Signed)
Subjective:  Patient ID: Christopher Schultz, male    DOB: August 06, 1970,  MRN: [id number]  Chief Complaint  Patient presents with  . Plantar Fasciitis    Pt still experiencing pain, scheduled for surgery on Monday to treat this issue.    49 y.o. male presents with the above complaint.  Patient presents with left heel pain/plantar fasciitis.  Patient states the pain is about the same.  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  His pain has not gotten better.  He is scheduled for surgery this Monday.  Today's visit is preoperative visit primarily to see if his pain had resolved or gotten better with cam boot immobilization.  His pain has not gotten any better.  He denies any other acute complaints.  He would like to undergo surgery as all the conservative treatment options have failed.   Review of Systems: Negative except as noted in the HPI. Denies N/V/F/Ch.  Past Medical History:  Diagnosis Date  . Hernia, abdominal   . Hypertension 2019  . Morbid obesity with BMI of 40.0-44.9, adult ([place])   . Morbid obesity with BMI of 40.0-44.9, adult ([place])   . OSA (obstructive sleep apnea) 2004  . OSA on CPAP   . Pre-diabetes   . Prediabetes 05/30/2017    Current Outpatient Medications:  .  amLODipine (NORVASC) 10 MG tablet, Take 1 tablet (10 mg total) by mouth daily., Disp: 90 tablet, Rfl: 3 .  atorvastatin (LIPITOR) 40 MG tablet, Take 1 tablet by mouth once daily, Disp: 30 tablet, Rfl: 9 .  cloNIDine (CATAPRES) 0.1 MG tablet, Take 2 tablets by mouth twice daily, Disp: 120 tablet, Rfl: 9 .  Diclofenac Sodium 1.5 % SOLN, Apply 2 g topically 4 (four) times daily as needed., Disp: 300 mL, Rfl: 2 .  glipiZIDE (GLUCOTROL) 5 MG tablet, TAKE 1 TABLET BY MOUTH TWICE DAILY BEFORE  A  MEAL, Disp: 60 tablet, Rfl: 9 .  ID NOW COVID-19 KIT, See admin instructions., Disp: , Rfl:  .  losartan (COZAAR) 100 MG tablet, TAKE 1 TABLET BY MOUTH ONCE DAILY(STOP LISINOPRIL), Disp: 30 tablet, Rfl: 9 .  metFORMIN (GLUCOPHAGE) 1000  MG tablet, 1 tab by mouth twice daily with meals, Disp: 180 tablet, Rfl: 3 .  terbinafine (LAMISIL) 250 MG tablet, 1 tab by mouth daily for 84 days., Disp: 84 tablet, Rfl: 0 .  ibuprofen (ADVIL) 800 MG tablet, Take 1 tablet (800 mg total) by mouth every 6 (six) hours as needed., Disp: 60 tablet, Rfl: 1 .  oxyCODONE-acetaminophen (PERCOCET) 10-325 MG tablet, Take 1 tablet by mouth every 8 (eight) hours as needed for up to 7 days for pain., Disp: 25 tablet, Rfl: 0  Social History   Tobacco Use  Smoking Status Never Smoker  Smokeless Tobacco Never Used    No Known Allergies Objective:  There were no vitals filed for this visit. There is no height or weight on file to calculate BMI. Constitutional Well developed. Well nourished.  Vascular Dorsalis pedis pulses palpable bilaterally. Posterior tibial pulses palpable bilaterally. Capillary refill normal to all digits.  No cyanosis or clubbing noted. Pedal hair growth normal.  Neurologic Normal speech. Oriented to person, place, and time. Epicritic sensation to light touch grossly present bilaterally.  Dermatologic Nails well groomed and normal in appearance. No open wounds. No skin lesions.  Orthopedic: Normal joint ROM without pain or crepitus bilaterally. No visible deformities. Tender to palpation at the calcaneal tuber left. No pain with calcaneal squeeze left. Ankle ROM diminished  range of motion left. Silfverskiold Test: positive left.  Left third metatarsophalangeal joint pain intra-articular noted.  Pain with range of motion of the of the third MTPJ joint.  No pain with resisted dorsiflexion as well as plantarflexion of the digits.   Radiographs: Taken and reviewed. No acute fractures or dislocations. No evidence of stress fracture.  Plantar heel spur present. Posterior heel spur present.   Assessment:   1. Gastrocnemius equinus, left   2. Plantar fasciitis of left foot   3. Preoperative examination    Plan:  Patient  was evaluated and treated and all questions answered.  Left  MTPJ joint capsulitis -Resolving  Plantar Fasciitis, left with underlying gastrocnemius equinus - Patient presents with continued follow-up of left plantar fasciitis and it appears that patient has failed all conservative therapy including injection orthotics.  I believe patient will not benefit from surgical intervention as he does not want to undergo cam boot immobilization as well.  Given the patient has not had any improvement in the pain has been about the same at this time patient would like to undergo surgery..  I discussed with the patient my postop protocol in extensive detail.  I discussed with him my surgical plan and my post operative plan in extensive detail.  He will be scheduled for left gastrocnemius recession with endoscopic plantar fasciotomy.  I discussed with the patient extensive detail my postop protocol at my postop incision planning.  Patient states understanding would like to proceed with the surgery. -Cam boot was dispensed -Informed surgical risk consent was reviewed and read aloud to the patient.  I reviewed the films.  I have discussed my findings with the patient in great detail.  I have discussed all risks including but not limited to infection, stiffness, scarring, limp, disability, deformity, damage to blood vessels and nerves, numbness, poor healing, need for braces, arthritis, chronic pain, amputation, death.  All benefits and realistic expectations discussed in great detail.  I have made no promises as to the outcome.  I have provided realistic expectations.  I have offered the patient a 2nd opinion, which they have declined and assured me they preferred to proceed despite the risks -Cam boot was dispensed -A total of 33 minutes was spent in direct patient care as well as pre and post patient encounter activities.  This includes documentation as well as reviewing patient chart for labs, imaging, past medical,  surgical, social, and family history as documented in the EMR.  I have reviewed medication allergies as documented in EMR.  I discussed the etiology of condition and treatment options from conservative to surgical care.  All risks and benefit of the treatment course was discussed in detail.  All questions were answered and return appointment was discussed.  Since the visit completed in an ambulatory/outpatient setting, the patient and/or parent/guardian has been advised to contact the providers office for worsening condition and seek medical treatment and/or call 911 if the patient deems either is necessary.  No follow-ups on file.

## 2020-04-07 NOTE — Telephone Encounter (Signed)
Thank you :)

## 2020-04-15 ENCOUNTER — Ambulatory Visit (INDEPENDENT_AMBULATORY_CARE_PROVIDER_SITE_OTHER): Payer: No Typology Code available for payment source | Admitting: Podiatry

## 2020-04-15 ENCOUNTER — Other Ambulatory Visit: Payer: Self-pay

## 2020-04-15 ENCOUNTER — Encounter: Payer: Self-pay | Admitting: Podiatry

## 2020-04-15 DIAGNOSIS — M216X2 Other acquired deformities of left foot: Secondary | ICD-10-CM

## 2020-04-15 DIAGNOSIS — M21862 Other specified acquired deformities of left lower leg: Secondary | ICD-10-CM

## 2020-04-15 DIAGNOSIS — Z9889 Other specified postprocedural states: Secondary | ICD-10-CM

## 2020-04-15 DIAGNOSIS — M722 Plantar fascial fibromatosis: Secondary | ICD-10-CM

## 2020-04-15 NOTE — Progress Notes (Signed)
  Subjective:  Patient ID: Christopher Schultz, male    DOB: 09-24-1970,  MRN: [id number]  Chief Complaint  Patient presents with  . Routine Post Op     POV #1 DOS 04/06/2020 LEFT FOOT EPF, GASTROCNEMIUS RECESS    49 y.o. male returns for post-op check.  Patient is doing well.  He states the incision is feeling fine.  He has been ambulating with the boot on.  He denies any other acute complaints.  The bandages have been clean dry and intact.  Review of Systems: Negative except as noted in the HPI. Denies N/V/F/Ch.  Past Medical History:  Diagnosis Date  . Hernia, abdominal   . Hypertension 2019  . Morbid obesity with BMI of 40.0-44.9, adult ([place])   . Morbid obesity with BMI of 40.0-44.9, adult ([place])   . OSA (obstructive sleep apnea) 2004  . OSA on CPAP   . Pre-diabetes   . Prediabetes 05/30/2017    Current Outpatient Medications:  .  amLODipine (NORVASC) 10 MG tablet, Take 1 tablet (10 mg total) by mouth daily., Disp: 90 tablet, Rfl: 3 .  atorvastatin (LIPITOR) 40 MG tablet, Take 1 tablet by mouth once daily, Disp: 30 tablet, Rfl: 9 .  cloNIDine (CATAPRES) 0.1 MG tablet, Take 2 tablets by mouth twice daily, Disp: 120 tablet, Rfl: 9 .  Diclofenac Sodium 1.5 % SOLN, Apply 2 g topically 4 (four) times daily as needed., Disp: 300 mL, Rfl: 2 .  glipiZIDE (GLUCOTROL) 5 MG tablet, TAKE 1 TABLET BY MOUTH TWICE DAILY BEFORE  A  MEAL, Disp: 60 tablet, Rfl: 9 .  ibuprofen (ADVIL) 800 MG tablet, Take 1 tablet (800 mg total) by mouth every 6 (six) hours as needed., Disp: 60 tablet, Rfl: 1 .  ID NOW COVID-19 KIT, See admin instructions., Disp: , Rfl:  .  losartan (COZAAR) 100 MG tablet, TAKE 1 TABLET BY MOUTH ONCE DAILY(STOP LISINOPRIL), Disp: 30 tablet, Rfl: 9 .  metFORMIN (GLUCOPHAGE) 1000 MG tablet, 1 tab by mouth twice daily with meals, Disp: 180 tablet, Rfl: 3 .  terbinafine (LAMISIL) 250 MG tablet, 1 tab by mouth daily for 84 days., Disp: 84 tablet, Rfl: 0  Social History   Tobacco Use    Smoking Status Never Smoker  Smokeless Tobacco Never Used    No Known Allergies Objective:  There were no vitals filed for this visit. There is no height or weight on file to calculate BMI. Constitutional Well developed. Well nourished.  Vascular Foot warm and well perfused. Capillary refill normal to all digits.   Neurologic Normal speech. Oriented to person, place, and time. Epicritic sensation to light touch grossly present bilaterally.  Dermatologic Skin healing well without signs of infection. Skin edges well coapted without signs of infection.  Orthopedic: Tenderness to palpation noted about the surgical site.   Radiographs: None Assessment:   1. Gastrocnemius equinus, left   2. Plantar fasciitis of left foot   3. Status post foot surgery    Plan:  Patient was evaluated and treated and all questions answered.  S/p foot surgery left -Progressing as expected post-operatively. -XR: None -WB Status: Weightbearing as tolerated in cam boot -Sutures: Intact.  No signs of dehiscence noted.  No clinical signs of infection noted. -Medications: None -Foot redressed.  No follow-ups on file.

## 2020-04-27 ENCOUNTER — Telehealth: Payer: Self-pay | Admitting: Podiatry

## 2020-04-27 MED ORDER — OXYCODONE-ACETAMINOPHEN 10-325 MG PO TABS
1.0000 | ORAL_TABLET | ORAL | 0 refills | Status: DC | PRN
Start: 2020-04-27 — End: 2020-04-29

## 2020-04-27 NOTE — Telephone Encounter (Signed)
Mr. Christopher Schultz would like to have pain med called in. He would like to have it called to [facility] on [address].[name] said he is in so much pain.

## 2020-04-27 NOTE — Addendum Note (Signed)
Addended by: Nicholes Rough on: 04/27/2020 09:45 AM   Modules accepted: Orders

## 2020-04-27 NOTE — Telephone Encounter (Signed)
done

## 2020-04-29 ENCOUNTER — Other Ambulatory Visit: Payer: Self-pay

## 2020-04-29 ENCOUNTER — Encounter: Payer: Self-pay | Admitting: Podiatry

## 2020-04-29 ENCOUNTER — Ambulatory Visit (INDEPENDENT_AMBULATORY_CARE_PROVIDER_SITE_OTHER): Payer: No Typology Code available for payment source | Admitting: Podiatry

## 2020-04-29 DIAGNOSIS — M722 Plantar fascial fibromatosis: Secondary | ICD-10-CM

## 2020-04-29 DIAGNOSIS — M216X2 Other acquired deformities of left foot: Secondary | ICD-10-CM

## 2020-04-29 DIAGNOSIS — M21862 Other specified acquired deformities of left lower leg: Secondary | ICD-10-CM

## 2020-04-29 MED ORDER — OXYCODONE-ACETAMINOPHEN 10-325 MG PO TABS: 1.0000 | ORAL_TABLET | ORAL | 0 refills | Status: DC | PRN

## 2020-04-29 NOTE — Progress Notes (Signed)
  Subjective:  Patient ID: Christopher Schultz, male    DOB: Apr 09, 1971,  MRN: [id number]  Chief Complaint  Patient presents with  . Routine Post Op     POV #2 DOS 04/06/2020 LEFT FOOT EPF, GASTROCNEMIUS RECESS    49 y.o. male returns for post-op check.  Patient is doing well.  He states the incision is feeling fine.  He has been ambulating with the boot on.  He denies any other acute complaints.  The bandages have been clean dry and intact.  Review of Systems: Negative except as noted in the HPI. Denies N/V/F/Ch.  Past Medical History:  Diagnosis Date  . Hernia, abdominal   . Hypertension 2019  . Morbid obesity with BMI of 40.0-44.9, adult ([place])   . Morbid obesity with BMI of 40.0-44.9, adult ([place])   . OSA (obstructive sleep apnea) 2004  . OSA on CPAP   . Pre-diabetes   . Prediabetes 05/30/2017    Current Outpatient Medications:  .  amLODipine (NORVASC) 10 MG tablet, Take 1 tablet (10 mg total) by mouth daily., Disp: 90 tablet, Rfl: 3 .  atorvastatin (LIPITOR) 40 MG tablet, Take 1 tablet by mouth once daily, Disp: 30 tablet, Rfl: 9 .  cloNIDine (CATAPRES) 0.1 MG tablet, Take 2 tablets by mouth twice daily, Disp: 120 tablet, Rfl: 9 .  Diclofenac Sodium 1.5 % SOLN, Apply 2 g topically 4 (four) times daily as needed., Disp: 300 mL, Rfl: 2 .  glipiZIDE (GLUCOTROL) 5 MG tablet, TAKE 1 TABLET BY MOUTH TWICE DAILY BEFORE  A  MEAL, Disp: 60 tablet, Rfl: 9 .  ibuprofen (ADVIL) 800 MG tablet, Take 1 tablet (800 mg total) by mouth every 6 (six) hours as needed., Disp: 60 tablet, Rfl: 1 .  ID NOW COVID-19 KIT, See admin instructions., Disp: , Rfl:  .  losartan (COZAAR) 100 MG tablet, TAKE 1 TABLET BY MOUTH ONCE DAILY(STOP LISINOPRIL), Disp: 30 tablet, Rfl: 9 .  metFORMIN (GLUCOPHAGE) 1000 MG tablet, 1 tab by mouth twice daily with meals, Disp: 180 tablet, Rfl: 3 .  oxyCODONE-acetaminophen (PERCOCET) 10-325 MG tablet, Take 1 tablet by mouth every 4 (four) hours as needed for pain., Disp: 30 tablet,  Rfl: 0 .  terbinafine (LAMISIL) 250 MG tablet, 1 tab by mouth daily for 84 days., Disp: 84 tablet, Rfl: 0  Social History   Tobacco Use  Smoking Status Never Smoker  Smokeless Tobacco Never Used    No Known Allergies Objective:  There were no vitals filed for this visit. There is no height or weight on file to calculate BMI. Constitutional Well developed. Well nourished.  Vascular Foot warm and well perfused. Capillary refill normal to all digits.   Neurologic Normal speech. Oriented to person, place, and time. Epicritic sensation to light touch grossly present bilaterally.  Dermatologic Skin healing well without signs of infection. Skin edges well coapted without signs of infection.  Orthopedic: Tenderness to palpation noted about the surgical site.   Radiographs: None Assessment:   No diagnosis found. Plan:  Patient was evaluated and treated and all questions answered.  S/p foot surgery left -Progressing as expected post-operatively. -XR: None -WB Status: Transition to weightbearing as tolerated in surgical shoe -Sutures: Sutures were removed no signs of dehiscence noted.  No clinical signs of infection noted. -Medications: None -Foot redressed.  No follow-ups on file.

## 2020-04-30 NOTE — Telephone Encounter (Signed)
Pt called back and said the pharmacy sent over some paperwork that needs to be filled out by the provider in order for him to get his medication. Please Advise

## 2020-05-01 ENCOUNTER — Telehealth: Payer: Self-pay | Admitting: Podiatry

## 2020-05-01 NOTE — Telephone Encounter (Signed)
Its fine they can proceed

## 2020-05-01 NOTE — Telephone Encounter (Signed)
Let them know I have done the prior authorization just waiting on your insurance company to approve it.

## 2020-05-01 NOTE — Telephone Encounter (Signed)
Pharmacy called stating the dose for the oxyCODONE 10-325 is high, and he's never had it before. They would like to know if you want to proceed with the dosage, or do something different. Please advise.    [phone], call back number

## 2020-05-01 NOTE — Telephone Encounter (Signed)
Patient called in stating they couldn't pick up prescription, I contacted pharmacy and they stated patient needed prior authorization in order for meds to be filled. The pharmacist stated they have sent over fax twice to [phone], please advise

## 2020-05-04 ENCOUNTER — Ambulatory Visit: Payer: No Typology Code available for payment source | Admitting: Internal Medicine

## 2020-05-27 ENCOUNTER — Encounter: Payer: Self-pay | Admitting: Podiatry

## 2020-05-27 ENCOUNTER — Other Ambulatory Visit: Payer: Self-pay

## 2020-05-27 ENCOUNTER — Ambulatory Visit (INDEPENDENT_AMBULATORY_CARE_PROVIDER_SITE_OTHER): Payer: No Typology Code available for payment source | Admitting: Podiatry

## 2020-05-27 DIAGNOSIS — M21962 Unspecified acquired deformity of left lower leg: Secondary | ICD-10-CM | POA: Diagnosis not present

## 2020-05-27 DIAGNOSIS — Z9889 Other specified postprocedural states: Secondary | ICD-10-CM

## 2020-05-27 DIAGNOSIS — Q666 Other congenital valgus deformities of feet: Secondary | ICD-10-CM

## 2020-05-27 DIAGNOSIS — M722 Plantar fascial fibromatosis: Secondary | ICD-10-CM

## 2020-05-27 DIAGNOSIS — M21862 Other specified acquired deformities of left lower leg: Secondary | ICD-10-CM

## 2020-05-27 DIAGNOSIS — M21961 Unspecified acquired deformity of right lower leg: Secondary | ICD-10-CM | POA: Diagnosis not present

## 2020-05-27 NOTE — Progress Notes (Signed)
Subjective:  Patient ID: Christopher Schultz, male    DOB: 1970/12/26,  MRN: [id number]  Chief Complaint  Patient presents with  . Routine Post Op    Pt stated that he is doing well but he still has some soreness on the back of the leg.     49 y.o. male returns for post-op check.  Patient is doing well.  He states the incision is feeling fine.  He has been ambulating with the boot on.  He denies any other acute complaints.  The bandages have been clean dry and intact.  Review of Systems: Negative except as noted in the HPI. Denies N/V/F/Ch.  Past Medical History:  Diagnosis Date  . Hernia, abdominal   . Hypertension 2019  . Morbid obesity with BMI of 40.0-44.9, adult ([place])   . Morbid obesity with BMI of 40.0-44.9, adult ([place])   . OSA (obstructive sleep apnea) 2004  . OSA on CPAP   . Pre-diabetes   . Prediabetes 05/30/2017    Current Outpatient Medications:  .  amLODipine (NORVASC) 10 MG tablet, Take 1 tablet (10 mg total) by mouth daily., Disp: 90 tablet, Rfl: 3 .  atorvastatin (LIPITOR) 40 MG tablet, Take 1 tablet by mouth once daily, Disp: 30 tablet, Rfl: 9 .  cloNIDine (CATAPRES) 0.1 MG tablet, Take 2 tablets by mouth twice daily, Disp: 120 tablet, Rfl: 9 .  Diclofenac Sodium 1.5 % SOLN, Apply 2 g topically 4 (four) times daily as needed., Disp: 300 mL, Rfl: 2 .  glipiZIDE (GLUCOTROL) 5 MG tablet, TAKE 1 TABLET BY MOUTH TWICE DAILY BEFORE  A  MEAL, Disp: 60 tablet, Rfl: 9 .  ibuprofen (ADVIL) 800 MG tablet, Take 1 tablet (800 mg total) by mouth every 6 (six) hours as needed., Disp: 60 tablet, Rfl: 1 .  ID NOW COVID-19 KIT, See admin instructions., Disp: , Rfl:  .  losartan (COZAAR) 100 MG tablet, TAKE 1 TABLET BY MOUTH ONCE DAILY(STOP LISINOPRIL), Disp: 30 tablet, Rfl: 9 .  metFORMIN (GLUCOPHAGE) 1000 MG tablet, 1 tab by mouth twice daily with meals, Disp: 180 tablet, Rfl: 3 .  oxyCODONE-acetaminophen (PERCOCET) 10-325 MG tablet, Take 1 tablet by mouth every 4 (four) hours as needed  for pain., Disp: 30 tablet, Rfl: 0 .  terbinafine (LAMISIL) 250 MG tablet, 1 tab by mouth daily for 84 days., Disp: 84 tablet, Rfl: 0  Social History   Tobacco Use  Smoking Status Never Smoker  Smokeless Tobacco Never Used    No Known Allergies Objective:  There were no vitals filed for this visit. There is no height or weight on file to calculate BMI. Constitutional Well developed. Well nourished.  Vascular Foot warm and well perfused. Capillary refill normal to all digits.   Neurologic Normal speech. Oriented to person, place, and time. Epicritic sensation to light touch grossly present bilaterally.  Dermatologic Skin healing well without signs of infection. Skin edges well coapted without signs of infection.  Orthopedic: Tenderness to palpation noted about the surgical site.   Radiographs: None Assessment:   1. Pes planovalgus   2. Acquired deformity of both feet   3. Gastrocnemius equinus, left   4. Plantar fasciitis of left foot   5. Status post foot surgery    Plan:  Patient was evaluated and treated and all questions answered.  S/p foot surgery left -Progressing as expected post-operatively. -XR: None -WB Status: Transition to weightbearing as tolerated in surgical shoe -Sutures: Sutures were removed no signs of dehiscence noted.  No clinical signs of infection noted. -Medications: None -Start doing physical therapy to help with range of motion and decrease the pain.  Patient states understanding will go [facility]  Pes planovalgus/other deformity of the foot -I explained to the patient the deformity of the foot in relationship of pes planovalgus and the treatment options that are available.  Given the pes planovalgus deformity of the foot I believe patient will benefit from custom-made orthotics to help control the hindfoot motion and support the arch of the foot as well as take the stress away from plantar fascia. -He will be  scheduled see rec for custom-made orthotics  Return in about 4 weeks (around 06/24/2020) for Sched with Liliane Channel for [employer].

## 2020-06-01 ENCOUNTER — Other Ambulatory Visit: Payer: No Typology Code available for payment source | Admitting: Orthotics

## 2020-06-16 ENCOUNTER — Other Ambulatory Visit: Payer: No Typology Code available for payment source | Admitting: Orthotics

## 2020-06-24 ENCOUNTER — Encounter: Payer: Self-pay | Admitting: Podiatry

## 2020-06-24 ENCOUNTER — Other Ambulatory Visit: Payer: Self-pay

## 2020-06-24 ENCOUNTER — Ambulatory Visit (INDEPENDENT_AMBULATORY_CARE_PROVIDER_SITE_OTHER): Payer: No Typology Code available for payment source | Admitting: Podiatry

## 2020-06-24 DIAGNOSIS — M21961 Unspecified acquired deformity of right lower leg: Secondary | ICD-10-CM

## 2020-06-24 DIAGNOSIS — M216X2 Other acquired deformities of left foot: Secondary | ICD-10-CM

## 2020-06-24 DIAGNOSIS — Z9889 Other specified postprocedural states: Secondary | ICD-10-CM

## 2020-06-24 DIAGNOSIS — Q666 Other congenital valgus deformities of feet: Secondary | ICD-10-CM

## 2020-06-24 DIAGNOSIS — M21962 Unspecified acquired deformity of left lower leg: Secondary | ICD-10-CM

## 2020-06-24 DIAGNOSIS — M21862 Other specified acquired deformities of left lower leg: Secondary | ICD-10-CM

## 2020-06-25 ENCOUNTER — Encounter: Payer: Self-pay | Admitting: Podiatry

## 2020-06-25 NOTE — Progress Notes (Signed)
Subjective:  Patient ID: Christopher Schultz, male    DOB: [birth year],  MRN: [id number]  Chief Complaint  Patient presents with  . Routine Post Op    POST OP DOS 11.8.21    50 y.o. male returns for post-op check.  Patient is doing well.  He states the incision is feeling fine.  He has been ambulating with the boot on.  He denies any other acute complaints.  The bandages have been clean dry and intact.  Review of Systems: Negative except as noted in the HPI. Denies N/V/F/Ch.  Past Medical History:  Diagnosis Date  . Hernia, abdominal   . Hypertension 2019  . Morbid obesity with BMI of 40.0-44.9, adult ([place])   . Morbid obesity with BMI of 40.0-44.9, adult ([place])   . OSA (obstructive sleep apnea) 2004  . OSA on CPAP   . Pre-diabetes   . Prediabetes 05/30/2017    Current Outpatient Medications:  .  amLODipine (NORVASC) 10 MG tablet, Take 1 tablet (10 mg total) by mouth daily., Disp: 90 tablet, Rfl: 3 .  atorvastatin (LIPITOR) 40 MG tablet, Take 1 tablet by mouth once daily, Disp: 30 tablet, Rfl: 9 .  cloNIDine (CATAPRES) 0.1 MG tablet, Take 2 tablets by mouth twice daily, Disp: 120 tablet, Rfl: 9 .  Diclofenac Sodium 1.5 % SOLN, Apply 2 g topically 4 (four) times daily as needed., Disp: 300 mL, Rfl: 2 .  glipiZIDE (GLUCOTROL) 5 MG tablet, TAKE 1 TABLET BY MOUTH TWICE DAILY BEFORE  A  MEAL, Disp: 60 tablet, Rfl: 9 .  ibuprofen (ADVIL) 800 MG tablet, Take 1 tablet (800 mg total) by mouth every 6 (six) hours as needed., Disp: 60 tablet, Rfl: 1 .  ID NOW COVID-19 KIT, See admin instructions., Disp: , Rfl:  .  losartan (COZAAR) 100 MG tablet, TAKE 1 TABLET BY MOUTH ONCE DAILY(STOP LISINOPRIL), Disp: 30 tablet, Rfl: 9 .  metFORMIN (GLUCOPHAGE) 1000 MG tablet, 1 tab by mouth twice daily with meals, Disp: 180 tablet, Rfl: 3 .  oxyCODONE-acetaminophen (PERCOCET) 10-325 MG tablet, Take 1 tablet by mouth every 4 (four) hours as needed for pain., Disp: 30 tablet, Rfl: 0 .  terbinafine (LAMISIL) 250 MG  tablet, 1 tab by mouth daily for 84 days., Disp: 84 tablet, Rfl: 0  Social History   Tobacco Use  Smoking Status Never Smoker  Smokeless Tobacco Never Used    No Known Allergies Objective:  There were no vitals filed for this visit. There is no height or weight on file to calculate BMI. Constitutional Well developed. Well nourished.  Vascular Foot warm and well perfused. Capillary refill normal to all digits.   Neurologic Normal speech. Oriented to person, place, and time. Epicritic sensation to light touch grossly present bilaterally.  Dermatologic Skin healing well without signs of infection. Skin edges well coapted without signs of infection.  Orthopedic: Tenderness to palpation noted about the surgical site.   Radiographs: None Assessment:   1. Pes planovalgus   2. Gastrocnemius equinus, left   3. Acquired deformity of both feet   4. Status post foot surgery    Plan:  Patient was evaluated and treated and all questions answered.  S/p foot surgery left -Progressing as expected post-operatively. -XR: None -WB Status: Transition to weightbearing as tolerated in surgical shoe -Sutures: None -Medications: None -Continue physical therapy.  Pes planovalgus/other deformity of the foot -I explained to the patient the deformity of the foot in relationship of pes planovalgus and the treatment options that  are available.  Given the pes planovalgus deformity of the foot I believe patient will benefit from custom-made orthotics to help control the hindfoot motion and support the arch of the foot as well as take the stress away from plantar fascia. -He has an appointment rescheduled with Rick due to snow.  No follow-ups on file.

## 2020-06-29 ENCOUNTER — Ambulatory Visit (INDEPENDENT_AMBULATORY_CARE_PROVIDER_SITE_OTHER): Payer: No Typology Code available for payment source | Admitting: Orthotics

## 2020-06-29 ENCOUNTER — Other Ambulatory Visit: Payer: Self-pay

## 2020-06-29 DIAGNOSIS — M21862 Other specified acquired deformities of left lower leg: Secondary | ICD-10-CM

## 2020-06-29 DIAGNOSIS — Q666 Other congenital valgus deformities of feet: Secondary | ICD-10-CM

## 2020-06-29 DIAGNOSIS — M216X2 Other acquired deformities of left foot: Secondary | ICD-10-CM

## 2020-06-29 NOTE — Progress Notes (Signed)

## 2020-07-01 ENCOUNTER — Ambulatory Visit: Payer: No Typology Code available for payment source | Admitting: Internal Medicine

## 2020-07-29 ENCOUNTER — Other Ambulatory Visit: Payer: Self-pay

## 2020-07-29 ENCOUNTER — Encounter: Payer: Self-pay | Admitting: Podiatry

## 2020-07-29 ENCOUNTER — Ambulatory Visit (INDEPENDENT_AMBULATORY_CARE_PROVIDER_SITE_OTHER): Payer: No Typology Code available for payment source | Admitting: Podiatry

## 2020-07-29 DIAGNOSIS — M21962 Unspecified acquired deformity of left lower leg: Secondary | ICD-10-CM

## 2020-07-29 DIAGNOSIS — Q666 Other congenital valgus deformities of feet: Secondary | ICD-10-CM | POA: Diagnosis not present

## 2020-07-29 DIAGNOSIS — M21961 Unspecified acquired deformity of right lower leg: Secondary | ICD-10-CM

## 2020-07-29 NOTE — Progress Notes (Signed)
Subjective:  Patient ID: Christopher Schultz, male    DOB: January 01, 1971,  MRN: [id number]  Chief Complaint  Patient presents with  . Routine Post Op    POV -  Date of surgery 04/06/20    50 y.o. male returns for post-op check.  Patient is doing well.  He states the incision is feeling fine.  He has been ambulating with regular shoes on denies any other acute complaints.  He is here to pick up orthotics as well. Review of Systems: Negative except as noted in the HPI. Denies N/V/F/Ch.  Past Medical History:  Diagnosis Date  . Hernia, abdominal   . Hypertension 2019  . Morbid obesity with BMI of 40.0-44.9, adult ([facility])   . Morbid obesity with BMI of 40.0-44.9, adult ([facility])   . OSA (obstructive sleep apnea) 2004  . OSA on CPAP   . Pre-diabetes   . Prediabetes 05/30/2017    Current Outpatient Medications:  .  amLODipine (NORVASC) 10 MG tablet, Take 1 tablet (10 mg total) by mouth daily., Disp: 90 tablet, Rfl: 3 .  atorvastatin (LIPITOR) 40 MG tablet, Take 1 tablet by mouth once daily, Disp: 30 tablet, Rfl: 9 .  cloNIDine (CATAPRES) 0.1 MG tablet, Take 2 tablets by mouth twice daily, Disp: 120 tablet, Rfl: 9 .  Diclofenac Sodium 1.5 % SOLN, Apply 2 g topically 4 (four) times daily as needed., Disp: 300 mL, Rfl: 2 .  glipiZIDE (GLUCOTROL) 5 MG tablet, TAKE 1 TABLET BY MOUTH TWICE DAILY BEFORE  A  MEAL, Disp: 60 tablet, Rfl: 9 .  ibuprofen (ADVIL) 800 MG tablet, Take 1 tablet (800 mg total) by mouth every 6 (six) hours as needed., Disp: 60 tablet, Rfl: 1 .  ID NOW COVID-19 KIT, See admin instructions., Disp: , Rfl:  .  losartan (COZAAR) 100 MG tablet, TAKE 1 TABLET BY MOUTH ONCE DAILY(STOP LISINOPRIL), Disp: 30 tablet, Rfl: 9 .  metFORMIN (GLUCOPHAGE) 1000 MG tablet, 1 tab by mouth twice daily with meals, Disp: 180 tablet, Rfl: 3 .  oxyCODONE-acetaminophen (PERCOCET) 10-325 MG tablet, Take 1 tablet by mouth every 4 (four) hours as needed for pain., Disp: 30 tablet, Rfl: 0 .  terbinafine (LAMISIL) 250  MG tablet, 1 tab by mouth daily for 84 days., Disp: 84 tablet, Rfl: 0  Social History   Tobacco Use  Smoking Status Never Smoker  Smokeless Tobacco Never Used    No Known Allergies Objective:  There were no vitals filed for this visit. There is no height or weight on file to calculate BMI. Constitutional Well developed. Well nourished.  Vascular Foot warm and well perfused. Capillary refill normal to all digits.   Neurologic Normal speech. Oriented to person, place, and time. Epicritic sensation to light touch grossly present bilaterally.  Dermatologic Skin healing well without signs of infection. Skin edges well coapted without signs of infection.  Good range of motion noted at the ankle joint.  No taunt plantar fascia noted.  Orthopedic:  Mild tenderness to palpation noted about the surgical site.   Radiographs: None Assessment:   1. Pes planovalgus   2. Acquired deformity of both feet    Plan:  Patient was evaluated and treated and all questions answered.  S/p foot surgery left -Clinically improving.  At this time patient is about 70 to 80% better but definitely considerably lower than what before he had surgery.  Pes planovalgus/other deformity of the foot -I explained to the patient the deformity of the foot in relationship of pes  planovalgus and the treatment options that are available.  Given the pes planovalgus deformity of the foot I believe patient will benefit from custom-made orthotics to help control the hindfoot motion and support the arch of the foot as well as take the stress away from plantar fascia. -Orthotics were picked up and dispensed.  Good fit noted.  No acute complaints.  I have asked him to break them in slowly.  He states understanding  No follow-ups on file.

## 2020-08-26 ENCOUNTER — Encounter: Payer: Self-pay | Admitting: Podiatry

## 2020-08-26 ENCOUNTER — Other Ambulatory Visit: Payer: Self-pay

## 2020-08-26 ENCOUNTER — Ambulatory Visit: Payer: No Typology Code available for payment source | Admitting: Podiatry

## 2020-08-26 DIAGNOSIS — M722 Plantar fascial fibromatosis: Secondary | ICD-10-CM | POA: Diagnosis not present

## 2020-08-26 MED ORDER — MELOXICAM 15 MG PO TABS: 15.0000 mg | ORAL_TABLET | Freq: Every day | ORAL | 0 refills | Status: DC

## 2020-08-27 ENCOUNTER — Encounter: Payer: Self-pay | Admitting: Podiatry

## 2020-08-27 NOTE — Progress Notes (Signed)
Subjective:  Patient ID: Christopher Schultz, male    DOB: [birth year],  MRN: [id number]  Chief Complaint  Patient presents with  . Routine Post Op    PT stated that he is still waking up with his foot being sore    50 y.o. male presents with the above complaint.  Patient presents with follow-up of left plantar fasciitis pain.  Patient states his still hurting and is being sore.  He spent a good amount of money making shoe gear changes.  He had surgery done by me on 04/06/2020.  At first he was doing really good however it seems like his plantar fasciitis pain may be coming back.  He is going to try [facility] for shoes as well.  He has not tried Mobic.  He denies any other acute complaints.  The orthotics seems to be working okay as well.  He has not missed work shoes.   Review of Systems: Negative except as noted in the HPI. Denies N/V/F/Ch.  Past Medical History:  Diagnosis Date  . Hernia, abdominal   . Hypertension 2019  . Morbid obesity with BMI of 40.0-44.9, adult ([place])   . Morbid obesity with BMI of 40.0-44.9, adult ([place])   . OSA (obstructive sleep apnea) 2004  . OSA on CPAP   . Pre-diabetes   . Prediabetes 05/30/2017    Current Outpatient Medications:  .  meloxicam (MOBIC) 15 MG tablet, Take 1 tablet (15 mg total) by mouth daily., Disp: 30 tablet, Rfl: 0 .  amLODipine (NORVASC) 10 MG tablet, Take 1 tablet (10 mg total) by mouth daily., Disp: 90 tablet, Rfl: 3 .  atorvastatin (LIPITOR) 40 MG tablet, Take 1 tablet by mouth once daily, Disp: 30 tablet, Rfl: 9 .  cloNIDine (CATAPRES) 0.1 MG tablet, Take 2 tablets by mouth twice daily, Disp: 120 tablet, Rfl: 9 .  Diclofenac Sodium 1.5 % SOLN, Apply 2 g topically 4 (four) times daily as needed., Disp: 300 mL, Rfl: 2 .  glipiZIDE (GLUCOTROL) 5 MG tablet, TAKE 1 TABLET BY MOUTH TWICE DAILY BEFORE  A  MEAL, Disp: 60 tablet, Rfl: 9 .  ibuprofen (ADVIL) 800 MG tablet, Take 1 tablet (800 mg total) by mouth every 6 (six) hours as needed., Disp: 60  tablet, Rfl: 1 .  ID NOW COVID-19 KIT, See admin instructions., Disp: , Rfl:  .  losartan (COZAAR) 100 MG tablet, TAKE 1 TABLET BY MOUTH ONCE DAILY(STOP LISINOPRIL), Disp: 30 tablet, Rfl: 9 .  metFORMIN (GLUCOPHAGE) 1000 MG tablet, 1 tab by mouth twice daily with meals, Disp: 180 tablet, Rfl: 3 .  oxyCODONE-acetaminophen (PERCOCET) 10-325 MG tablet, Take 1 tablet by mouth every 4 (four) hours as needed for pain., Disp: 30 tablet, Rfl: 0 .  terbinafine (LAMISIL) 250 MG tablet, 1 tab by mouth daily for 84 days., Disp: 84 tablet, Rfl: 0  Social History   Tobacco Use  Smoking Status Never Smoker  Smokeless Tobacco Never Used    No Known Allergies Objective:  There were no vitals filed for this visit. There is no height or weight on file to calculate BMI. Constitutional Well developed. Well nourished.  Vascular Dorsalis pedis pulses palpable bilaterally. Posterior tibial pulses palpable bilaterally. Capillary refill normal to all digits.  No cyanosis or clubbing noted. Pedal hair growth normal.  Neurologic Normal speech. Oriented to person, place, and time. Epicritic sensation to light touch grossly present bilaterally.  Dermatologic Nails well groomed and normal in appearance. No open wounds. No skin lesions.  Orthopedic:  Normal joint ROM without pain or crepitus bilaterally. No visible deformities. Tender to palpation at the calcaneal tuber left. No pain with calcaneal squeeze left. Ankle ROM diminished range of motion left. Silfverskiold Test: negative left.   Radiographs: None  Assessment:   1. Plantar fasciitis of left foot    Plan:  Patient was evaluated and treated and all questions answered.  Plantar Fasciitis, left -I discussed with the patient that he may benefit from another steroid injection however patient would like to hold off on a steroid injection for now.  He may have been a being some recurrence of plantar fascia even status post surgery with endoscopic  plantar fasciotomy.  I discussed with the patient that less try [employer] for shoes with orthotics and and use Mobic to control the pain.  Patient states understanding will try that out. No follow-ups on file.

## 2020-09-06 ENCOUNTER — Other Ambulatory Visit: Payer: Self-pay | Admitting: Internal Medicine

## 2020-09-23 ENCOUNTER — Ambulatory Visit: Payer: No Typology Code available for payment source | Admitting: Podiatry

## 2020-09-23 ENCOUNTER — Encounter: Payer: Self-pay | Admitting: Podiatry

## 2020-09-23 ENCOUNTER — Other Ambulatory Visit: Payer: Self-pay

## 2020-09-23 DIAGNOSIS — M722 Plantar fascial fibromatosis: Secondary | ICD-10-CM

## 2020-09-23 DIAGNOSIS — M7732 Calcaneal spur, left foot: Secondary | ICD-10-CM

## 2020-09-23 NOTE — Progress Notes (Signed)
Subjective:  Patient ID: Christopher Schultz, male    DOB: June 20, 1970,  MRN: [id number]  Chief Complaint  Patient presents with  . Plantar Fasciitis    4 week f/u  LEFT FOOT EPF, GASTROCNEMIUS RECESS    50 y.o. male presents with the above complaint.  Patient presents with follow-up of left plantar fasciitis pain.  Patient states his still hurting and is being sore.  He spent a good amount of money making shoe gear changes.  He had surgery done by me on 04/06/2020.  At first he was doing really good however it seems like his plantar fasciitis pain may be coming back.  He is going to try [facility] for shoes as well.  He has not tried Mobic.  He denies any other acute complaints.  The orthotics seems to be working okay as well.  He has not missed work shoes.   Review of Systems: Negative except as noted in the HPI. Denies N/V/F/Ch.  Past Medical History:  Diagnosis Date  . Hernia, abdominal   . Hypertension 2019  . Morbid obesity with BMI of 40.0-44.9, adult ([place])   . Morbid obesity with BMI of 40.0-44.9, adult ([place])   . OSA (obstructive sleep apnea) 2004  . OSA on CPAP   . Pre-diabetes   . Prediabetes 05/30/2017    Current Outpatient Medications:  .  amLODipine (NORVASC) 10 MG tablet, Take 1 tablet by mouth once daily, Disp: 90 tablet, Rfl: 0 .  atorvastatin (LIPITOR) 40 MG tablet, Take 1 tablet by mouth once daily, Disp: 30 tablet, Rfl: 9 .  cloNIDine (CATAPRES) 0.1 MG tablet, Take 2 tablets by mouth twice daily, Disp: 120 tablet, Rfl: 9 .  Diclofenac Sodium 1.5 % SOLN, Apply 2 g topically 4 (four) times daily as needed., Disp: 300 mL, Rfl: 2 .  glipiZIDE (GLUCOTROL) 5 MG tablet, TAKE 1 TABLET BY MOUTH TWICE DAILY BEFORE  A  MEAL, Disp: 60 tablet, Rfl: 9 .  ibuprofen (ADVIL) 800 MG tablet, Take 1 tablet (800 mg total) by mouth every 6 (six) hours as needed., Disp: 60 tablet, Rfl: 1 .  ID NOW COVID-19 KIT, See admin instructions., Disp: , Rfl:  .  losartan (COZAAR) 100 MG tablet, TAKE 1 TABLET  BY MOUTH ONCE DAILY(STOP LISINOPRIL), Disp: 30 tablet, Rfl: 9 .  meloxicam (MOBIC) 15 MG tablet, Take 1 tablet (15 mg total) by mouth daily., Disp: 30 tablet, Rfl: 0 .  metFORMIN (GLUCOPHAGE) 1000 MG tablet, 1 tab by mouth twice daily with meals, Disp: 180 tablet, Rfl: 3 .  oxyCODONE-acetaminophen (PERCOCET) 10-325 MG tablet, Take 1 tablet by mouth every 4 (four) hours as needed for pain., Disp: 30 tablet, Rfl: 0 .  terbinafine (LAMISIL) 250 MG tablet, 1 tab by mouth daily for 84 days., Disp: 84 tablet, Rfl: 0  Social History   Tobacco Use  Smoking Status Never Smoker  Smokeless Tobacco Never Used    No Known Allergies Objective:  There were no vitals filed for this visit. There is no height or weight on file to calculate BMI. Constitutional Well developed. Well nourished.  Vascular Dorsalis pedis pulses palpable bilaterally. Posterior tibial pulses palpable bilaterally. Capillary refill normal to all digits.  No cyanosis or clubbing noted. Pedal hair growth normal.  Neurologic Normal speech. Oriented to person, place, and time. Epicritic sensation to light touch grossly present bilaterally.  Dermatologic Nails well groomed and normal in appearance. No open wounds. No skin lesions.  Orthopedic: Normal joint ROM without pain or crepitus  bilaterally. No visible deformities. Tender to palpation at the calcaneal tuber left. No pain with calcaneal squeeze left. Ankle ROM diminished range of motion left. Silfverskiold Test: negative left.   Radiographs: None  Assessment:   1. Plantar fasciitis of left foot   2. Heel spur, left    Plan:  Patient was evaluated and treated and all questions answered.  Plantar Fasciitis, left with underlying heel spur -I discussed with the patient that he may benefit from another steroid injection however patient would like to hold off on a steroid injection for now.  He may have been a being some recurrence of plantar fascia even status post  surgery with endoscopic plantar fasciotomy.  I discussed with the patient that less try [employer] for shoes with orthotics and and use Mobic to control the pain.  Patient states understanding will try that out. -Continue wearing night splint.  This injection was given to take some of the inflammation away.   Procedure: Injection Tendon/Ligament Location: Left plantar fascia at the glabrous junction; medial approach. Skin Prep: alcohol Injectate: 0.5 cc 0.5% marcaine plain, 0.5 cc of 1% Lidocaine, 0.5 cc kenalog 10. Disposition: Patient tolerated procedure well. Injection site dressed with a band-aid.  No follow-ups on file. No follow-ups on file.

## 2020-10-06 ENCOUNTER — Other Ambulatory Visit: Payer: Self-pay | Admitting: Internal Medicine

## 2020-10-23 ENCOUNTER — Encounter: Payer: Self-pay | Admitting: Podiatry

## 2020-10-23 ENCOUNTER — Other Ambulatory Visit: Payer: Self-pay

## 2020-10-23 ENCOUNTER — Ambulatory Visit (INDEPENDENT_AMBULATORY_CARE_PROVIDER_SITE_OTHER): Payer: No Typology Code available for payment source | Admitting: Podiatry

## 2020-10-23 DIAGNOSIS — M722 Plantar fascial fibromatosis: Secondary | ICD-10-CM

## 2020-10-23 MED ORDER — IBUPROFEN 800 MG PO TABS
800.0000 mg | ORAL_TABLET | Freq: Four times a day (QID) | ORAL | 1 refills | Status: DC | PRN
Start: 2020-10-23 — End: 2021-06-28

## 2020-10-27 ENCOUNTER — Encounter: Payer: Self-pay | Admitting: Podiatry

## 2020-10-27 NOTE — Progress Notes (Signed)
Subjective:  Patient ID: Christopher Schultz, male    DOB: 1971-03-02,  MRN: [id number]  Chief Complaint  Patient presents with  . Plantar Fasciitis    PT stated that he is still waking up with some soreness    50 y.o. male presents with the above complaint.  Patient presents with follow-up to left Planter fasciitis.  He states that he still has pretty good amount of pain.  He has not gotten the much relief with the steroid injection.  He denies any other acute complaints.   Review of Systems: Negative except as noted in the HPI. Denies N/V/F/Ch.  Past Medical History:  Diagnosis Date  . Hernia, abdominal   . Hypertension 2019  . Morbid obesity with BMI of 40.0-44.9, adult ([place])   . Morbid obesity with BMI of 40.0-44.9, adult ([place])   . OSA (obstructive sleep apnea) 2004  . OSA on CPAP   . Pre-diabetes   . Prediabetes 05/30/2017    Current Outpatient Medications:  .  amLODipine (NORVASC) 10 MG tablet, Take 1 tablet by mouth once daily, Disp: 90 tablet, Rfl: 0 .  atorvastatin (LIPITOR) 40 MG tablet, Take 1 tablet by mouth once daily, Disp: 30 tablet, Rfl: 9 .  cloNIDine (CATAPRES) 0.1 MG tablet, Take 2 tablets by mouth twice daily, Disp: 120 tablet, Rfl: 9 .  Diclofenac Sodium 1.5 % SOLN, Apply 2 g topically 4 (four) times daily as needed., Disp: 300 mL, Rfl: 2 .  glipiZIDE (GLUCOTROL) 5 MG tablet, TAKE 1 TABLET BY MOUTH TWICE DAILY BEFORE  A  MEAL, Disp: 60 tablet, Rfl: 9 .  ibuprofen (ADVIL) 800 MG tablet, Take 1 tablet (800 mg total) by mouth every 6 (six) hours as needed., Disp: 60 tablet, Rfl: 1 .  ID NOW COVID-19 KIT, See admin instructions., Disp: , Rfl:  .  losartan (COZAAR) 100 MG tablet, TAKE 1 TABLET BY MOUTH ONCE DAILY(STOP LISINOPRIL), Disp: 30 tablet, Rfl: 9 .  meloxicam (MOBIC) 15 MG tablet, Take 1 tablet (15 mg total) by mouth daily., Disp: 30 tablet, Rfl: 0 .  metFORMIN (GLUCOPHAGE) 1000 MG tablet, TAKE 1 TABLET BY MOUTH TWICE DAILY WITH MEALS, Disp: 180 tablet, Rfl: 0 .   oxyCODONE-acetaminophen (PERCOCET) 10-325 MG tablet, Take 1 tablet by mouth every 4 (four) hours as needed for pain., Disp: 30 tablet, Rfl: 0 .  terbinafine (LAMISIL) 250 MG tablet, 1 tab by mouth daily for 84 days., Disp: 84 tablet, Rfl: 0  Social History   Tobacco Use  Smoking Status Never Smoker  Smokeless Tobacco Never Used    No Known Allergies Objective:  There were no vitals filed for this visit. There is no height or weight on file to calculate BMI. Constitutional Well developed. Well nourished.  Vascular Dorsalis pedis pulses palpable bilaterally. Posterior tibial pulses palpable bilaterally. Capillary refill normal to all digits.  No cyanosis or clubbing noted. Pedal hair growth normal.  Neurologic Normal speech. Oriented to person, place, and time. Epicritic sensation to light touch grossly present bilaterally.  Dermatologic Nails well groomed and normal in appearance. No open wounds. No skin lesions.  Orthopedic: Normal joint ROM without pain or crepitus bilaterally. No visible deformities. Tender to palpation at the calcaneal tuber left. No pain with calcaneal squeeze left. Ankle ROM diminished range of motion left. Silfverskiold Test: negative left.   Radiographs: None  Assessment:   1. Plantar fasciitis of left foot    Plan:  Patient was evaluated and treated and all questions answered.  Plantar  Fasciitis, left with underlying heel spur -Continue taking meloxicam and Mobic for pain control.  I discussed that patient will benefit from PRP injection.  I will see him back again in next clinical visit and administer PRP injection.  Patient states understanding would like to proceed with this..  No follow-ups on file. No follow-ups on file.

## 2020-11-25 ENCOUNTER — Encounter: Payer: Self-pay | Admitting: Podiatry

## 2020-11-25 ENCOUNTER — Ambulatory Visit: Payer: Self-pay | Admitting: Podiatry

## 2020-11-25 ENCOUNTER — Ambulatory Visit: Payer: Self-pay

## 2020-12-15 ENCOUNTER — Telehealth: Payer: Self-pay | Admitting: Podiatry

## 2020-12-15 NOTE — Telephone Encounter (Signed)
Patient called and stated that he could not come into the office tomorrow for his appointment. He has flu like symptoms. He requested a work note

## 2020-12-16 ENCOUNTER — Ambulatory Visit: Payer: Self-pay | Admitting: Podiatry

## 2020-12-17 ENCOUNTER — Encounter: Payer: Self-pay | Admitting: Podiatry

## 2020-12-19 ENCOUNTER — Other Ambulatory Visit: Payer: Self-pay | Admitting: Internal Medicine

## 2020-12-29 ENCOUNTER — Other Ambulatory Visit: Payer: Self-pay

## 2020-12-29 ENCOUNTER — Emergency Department (HOSPITAL_COMMUNITY): Payer: No Typology Code available for payment source

## 2020-12-29 ENCOUNTER — Emergency Department (HOSPITAL_COMMUNITY)
Admission: EM | Admit: 2020-12-29 | Discharge: 2020-12-29 | Disposition: A | Discharge status: Home / Self Care | Payer: No Typology Code available for payment source | Attending: Emergency Medicine | Admitting: Emergency Medicine

## 2020-12-29 ENCOUNTER — Encounter (HOSPITAL_COMMUNITY): Payer: Self-pay

## 2020-12-29 DIAGNOSIS — Z79899 Other long term (current) drug therapy: Secondary | ICD-10-CM | POA: Insufficient documentation

## 2020-12-29 DIAGNOSIS — I10 Essential (primary) hypertension: Secondary | ICD-10-CM | POA: Insufficient documentation

## 2020-12-29 DIAGNOSIS — Z7984 Long term (current) use of oral hypoglycemic drugs: Secondary | ICD-10-CM | POA: Insufficient documentation

## 2020-12-29 DIAGNOSIS — R0781 Pleurodynia: Secondary | ICD-10-CM | POA: Insufficient documentation

## 2020-12-29 NOTE — ED Provider Notes (Signed)
Emergency Medicine Provider Triage Evaluation Note  Christopher Schultz , a 50 y.o. male  was evaluated in triage.  Pt complains of left rib pain that began last night.  Patient states he was lying in bed and coughed and then felt a "pop" in the region.  He then began experiencing pain across the left side of his ribs.  Denies chest pain or shortness of breath.  No hemoptysis.  No other complaints.  Physical Exam  There were no vitals taken for this visit. Gen:   Awake, no distress   Resp:  Normal effort  MSK:   Moves extremities without difficulty  Other:    Medical Decision Making  Medically screening exam initiated at 3:49 PM.  Appropriate orders placed.  Angelyn Punt was informed that the remainder of the evaluation will be completed by another provider, this initial triage assessment does not replace that evaluation, and the importance of remaining in the ED until their evaluation is complete.   Placido Sou, PA-C 12/29/20 1549    Ernie Avena, MD 12/30/20 [phone]

## 2020-12-29 NOTE — ED Provider Notes (Signed)
[facility] DEPT Provider Note   CSN: [id number] Arrival date & time: 12/29/20  1542     History Chief Complaint  Patient presents with   rib cage pain    Christopher Schultz is a 50 y.o. male.  HPI Patient is a 50 year old male with a medical history as noted below.  He presents to the emergency department due to left rib pain that began last night.  Patient states he was lying in bed and while coughing felt a pop in the region and then began experiencing pain in the ribs.  No chest pain, shortness of breath, or hemoptysis.  No other regions of pain.    Past Medical History:  Diagnosis Date   Hernia, abdominal    Hypertension 2019   Morbid obesity with BMI of 40.0-44.9, adult ([place])    Morbid obesity with BMI of 40.0-44.9, adult ([place])    OSA (obstructive sleep apnea) 2004   OSA on CPAP    Pre-diabetes    Prediabetes 05/30/2017    Patient Active Problem List   Diagnosis Date Noted   Left foot pain 12/24/2019   Uncontrolled type 2 diabetes mellitus with hyperglycemia ([place]) 12/24/2019   Tinea pedis of both feet 09/06/2019   Onychomycosis 09/06/2019   Mixed hyperlipidemia 02/05/2019   OSA (obstructive sleep apnea)    Essential hypertension 08/12/2017   Morbid obesity with BMI of 40.0-44.9, adult ([place]) 08/12/2017   Acute hyperglycemia 56/21/3086   Hernia, umbilical 57/84/6962   Umbilical hernia with obstruction 08/11/2017   Prediabetes 05/30/2017    Past Surgical History:  Procedure Laterality Date   ABDOMINAL HERNIA REPAIR  95/28/4132   umbilical herina repair for incarceration   KNEE SURGERY     left   plantar fiscitis     UMBILICAL HERNIA REPAIR  08/11/2017   Incarcerated hernia   UMBILICAL HERNIA REPAIR N/A 08/11/2017   Procedure: HERNIA REPAIR UMBILICAL ADULT OPEN;  Surgeon: Kinsinger, Arta Bruce, MD;  Location: [facility];  Service: General;  Laterality: N/A;       Family History  Problem Relation Age of Onset   Hypertension Mother     Fibromyalgia Mother    Diabetes Father    Hypertension Father    Peripheral Artery Disease Father    Hypertension Brother     Social History   Tobacco Use   Smoking status: Never   Smokeless tobacco: Never  Vaping Use   Vaping Use: Never used  Substance Use Topics   Alcohol use: Yes    Comment: ocassionally   Drug use: Yes    Types: Marijuana    Comment: x 1 month    Home Medications Prior to Admission medications   Medication Sig Start Date End Date Taking? Authorizing Provider  amLODipine (NORVASC) 10 MG tablet Take 1 tablet by mouth once daily 12/25/20   Mack Hook, MD  atorvastatin (LIPITOR) 40 MG tablet Take 1 tablet by mouth once daily 03/29/20   Mack Hook, MD  cloNIDine (CATAPRES) 0.1 MG tablet Take 2 tablets by mouth twice daily 03/29/20   Mack Hook, MD  Diclofenac Sodium 1.5 % SOLN Apply 2 g topically 4 (four) times daily as needed. 09/18/19   Mack Hook, MD  glipiZIDE (GLUCOTROL) 5 MG tablet TAKE 1 TABLET BY MOUTH TWICE DAILY BEFORE  A  MEAL 03/29/20   Mack Hook, MD  ibuprofen (ADVIL) 800 MG tablet Take 1 tablet (800 mg total) by mouth every 6 (six) hours as needed. 10/23/20   Posey Pronto,  Thomasene Lot, DPM  ID NOW COVID-19 KIT See admin instructions. 08/26/19   [provider]  losartan (COZAAR) 100 MG tablet TAKE 1 TABLET BY MOUTH ONCE DAILY(STOP LISINOPRIL) 03/29/20   Mack Hook, MD  meloxicam (MOBIC) 15 MG tablet Take 1 tablet (15 mg total) by mouth daily. 08/26/20   Felipa Furnace, DPM  metFORMIN (GLUCOPHAGE) 1000 MG tablet TAKE 1 TABLET BY MOUTH TWICE DAILY WITH MEALS 10/06/20   Mack Hook, MD  oxyCODONE-acetaminophen (PERCOCET) 10-325 MG tablet Take 1 tablet by mouth every 4 (four) hours as needed for pain. 04/29/20   Felipa Furnace, DPM  terbinafine (LAMISIL) 250 MG tablet 1 tab by mouth daily for 84 days. 11/05/19   Mack Hook, MD    Allergies    Patient has no known allergies.  Review of  Systems   Review of Systems  Respiratory:  Negative for shortness of breath.   Cardiovascular:  Negative for chest pain.  Musculoskeletal:  Positive for arthralgias and myalgias.  Skin:  Negative for color change and wound.  Neurological:  Negative for weakness.   Physical Exam Updated Vital Signs BP (!) 160/115 (BP Location: Left Arm)   Pulse 95   Temp 97.7 F (36.5 C) (Oral)   Resp 18   Ht 6' (1.829 m)   Wt (!) 147.4 kg   SpO2 98%   BMI 44.08 kg/m   Physical Exam Vitals and nursing note reviewed.  Constitutional:      General: He is not in acute distress.    Appearance: He is well-developed.  HENT:     Head: Normocephalic and atraumatic.     Right Ear: External ear normal.     Left Ear: External ear normal.  Eyes:     General: No scleral icterus.       Right eye: No discharge.        Left eye: No discharge.     Conjunctiva/sclera: Conjunctivae normal.  Neck:     Trachea: No tracheal deviation.  Cardiovascular:     Rate and Rhythm: Normal rate.     Pulses: Normal pulses.     Heart sounds: Normal heart sounds. No murmur heard.   No friction rub. No gallop.  Pulmonary:     Effort: Pulmonary effort is normal. No respiratory distress.     Breath sounds: Normal breath sounds. No stridor. No wheezing, rhonchi or rales.     Comments: Lungs are clear to auscultation bilaterally.  Symmetrical rise and fall the chest with inspiration and expiration. Abdominal:     General: There is no distension.  Musculoskeletal:        General: Tenderness present. No swelling or deformity.     Cervical back: Neck supple.     Comments: Mild to moderate TTP noted along the left inferior ribs.  No crepitus.  No anterior chest wall pain.  Skin:    General: Skin is warm and dry.     Findings: No rash.  Neurological:     General: No focal deficit present.     Mental Status: He is alert and oriented to person, place, and time.     Cranial Nerves: Cranial nerve deficit: no gross deficits.   Psychiatric:        Mood and Affect: Mood normal.        Behavior: Behavior normal.   ED Results / Procedures / Treatments   Labs (all labs ordered are listed, but only abnormal results are displayed) Labs Reviewed - No data  to display  EKG None  Radiology DG Ribs Unilateral W/Chest Left  Result Date: 12/29/2020 CLINICAL DATA:  Left rib pain after coughing. Heard a pop. Persistent pain. EXAM: LEFT RIBS AND CHEST - 3+ VIEW COMPARISON:  Chest radiograph 03/20/2019 FINDINGS: No fracture or other bone lesions are seen involving the ribs. There is no evidence of pneumothorax or pleural effusion. Both lungs are clear. Heart size and mediastinal contours are stable from prior exam. Borderline cardiomegaly. IMPRESSION: No fracture of left ribs or pulmonary complication. Electronically Signed   By: Keith Rake M.D.   On: 12/29/2020 17:03    Procedures Procedures   Medications Ordered in ED Medications - No data to display  ED Course  I have reviewed the triage vital signs and the nursing notes.  Pertinent labs & imaging results that were available during my care of the patient were reviewed by me and considered in my medical decision making (see chart for details).    MDM Rules/Calculators/A&P                          Patient is a 50 year old male who presents to the emergency department due to left rib pain that began when coughing last night.  Physical exam significant for some tenderness to the left lateral inferior ribs.  No crepitus.  Lungs are clear to auscultation bilaterally.  Symmetrical rise and fall the chest with inspiration and expiration.  X-rays were obtained in triage showing no fracture of the left ribs or pulmonary complication.  Feel the patient is stable for discharge at this time and he is agreeable.  Recommended heating pads as well as Tylenol/ibuprofen.  PCP follow-up if symptoms do not improve.  Patient understands to come back to the emergency department if  his symptoms worsen.  His questions were answered and he was amicable at the time of discharge.  Final Clinical Impression(s) / ED Diagnoses Final diagnoses:  Rib pain on left side   Rx / DC Orders ED Discharge Orders     None        Rayna Sexton, PA-C 12/29/20 1737    Regan Lemming, MD 12/30/20 [phone]

## 2020-12-29 NOTE — ED Triage Notes (Signed)
Patient reports that he coughed last night and felt a pop in the left rib cage area. Patient reports that he is still having pain in that area.

## 2020-12-29 NOTE — Discharge Instructions (Addendum)
I would recommend Tylenol and ibuprofen for management of your symptoms.  Please rotate these 2 medications.  Follow the instructions on the bottles.  I would also recommend applying heat to the region.  Try hot shower/hot baths as needed.  If your symptoms persist for the next 24 to 48 hours I would recommend following up with your regular doctor.  If they worsen please come back to the emergency department.  It was a pleasure to meet you.

## 2020-12-30 ENCOUNTER — Encounter: Payer: Self-pay | Admitting: Podiatry

## 2020-12-30 ENCOUNTER — Other Ambulatory Visit: Payer: Self-pay

## 2020-12-30 ENCOUNTER — Ambulatory Visit (INDEPENDENT_AMBULATORY_CARE_PROVIDER_SITE_OTHER): Payer: Self-pay | Admitting: Podiatry

## 2020-12-30 DIAGNOSIS — M722 Plantar fascial fibromatosis: Secondary | ICD-10-CM

## 2020-12-30 NOTE — Progress Notes (Signed)
Subjective:  Patient ID: Christopher Schultz, male    DOB: 12-24-1970,  MRN: [id number]  Chief Complaint  Patient presents with   Plantar Fasciitis    Pt stated that when he gets up he still has some soreness     50 y.o. male presents with the above complaint.  Patient presents with follow-up to left Planter fasciitis.  He states that he still has pretty good amount of pain.  He has not gotten the much relief with the steroid injection.  He denies any other acute complaints.   Review of Systems: Negative except as noted in the HPI. Denies N/V/F/Ch.  Past Medical History:  Diagnosis Date   Hernia, abdominal    Hypertension 2019   Morbid obesity with BMI of 40.0-44.9, adult ([place])    Morbid obesity with BMI of 40.0-44.9, adult ([facility])    OSA (obstructive sleep apnea) 2004   OSA on CPAP    Pre-diabetes    Prediabetes 05/30/2017    Current Outpatient Medications:    amLODipine (NORVASC) 10 MG tablet, Take 1 tablet by mouth once daily, Disp: 30 tablet, Rfl: 0   atorvastatin (LIPITOR) 40 MG tablet, Take 1 tablet by mouth once daily, Disp: 30 tablet, Rfl: 9   cloNIDine (CATAPRES) 0.1 MG tablet, Take 2 tablets by mouth twice daily, Disp: 120 tablet, Rfl: 9   Diclofenac Sodium 1.5 % SOLN, Apply 2 g topically 4 (four) times daily as needed., Disp: 300 mL, Rfl: 2   glipiZIDE (GLUCOTROL) 5 MG tablet, TAKE 1 TABLET BY MOUTH TWICE DAILY BEFORE  A  MEAL, Disp: 60 tablet, Rfl: 9   ibuprofen (ADVIL) 800 MG tablet, Take 1 tablet (800 mg total) by mouth every 6 (six) hours as needed., Disp: 60 tablet, Rfl: 1   ID NOW COVID-19 KIT, See admin instructions., Disp: , Rfl:    losartan (COZAAR) 100 MG tablet, TAKE 1 TABLET BY MOUTH ONCE DAILY(STOP LISINOPRIL), Disp: 30 tablet, Rfl: 9   meloxicam (MOBIC) 15 MG tablet, Take 1 tablet (15 mg total) by mouth daily., Disp: 30 tablet, Rfl: 0   metFORMIN (GLUCOPHAGE) 1000 MG tablet, TAKE 1 TABLET BY MOUTH TWICE DAILY WITH MEALS, Disp: 180 tablet, Rfl: 0    oxyCODONE-acetaminophen (PERCOCET) 10-325 MG tablet, Take 1 tablet by mouth every 4 (four) hours as needed for pain., Disp: 30 tablet, Rfl: 0   terbinafine (LAMISIL) 250 MG tablet, 1 tab by mouth daily for 84 days., Disp: 84 tablet, Rfl: 0  Social History   Tobacco Use  Smoking Status Never  Smokeless Tobacco Never    No Known Allergies Objective:  There were no vitals filed for this visit. There is no height or weight on file to calculate BMI. Constitutional Well developed. Well nourished.  Vascular Dorsalis pedis pulses palpable bilaterally. Posterior tibial pulses palpable bilaterally. Capillary refill normal to all digits.  No cyanosis or clubbing noted. Pedal hair growth normal.  Neurologic Normal speech. Oriented to person, place, and time. Epicritic sensation to light touch grossly present bilaterally.  Dermatologic Nails well groomed and normal in appearance. No open wounds. No skin lesions.  Orthopedic: Normal joint ROM without pain or crepitus bilaterally. No visible deformities. Tender to palpation at the calcaneal tuber left. No pain with calcaneal squeeze left. Ankle ROM diminished range of motion left. Silfverskiold Test: negative left.   Radiographs: None  Assessment:   1. Plantar fasciitis of left foot     Plan:  Patient was evaluated and treated and all questions answered.  Plantar Fasciitis, left with underlying heel spur -Continue taking meloxicam and Mobic for pain control.  I have read discussed with him the benefit of PRP injection given that he still has some residual pain.  Patient is scheduled for PRP injection. No follow-ups on file. No follow-ups on file.

## 2021-01-12 ENCOUNTER — Telehealth: Payer: Self-pay | Admitting: Podiatry

## 2021-01-12 NOTE — Telephone Encounter (Signed)
Pt called to cancel PRP appt that was set for tomorrow at 9:30. He is also requesting a letter to remain out of work. With your consent, I will get it filled out for him. Please advise.

## 2021-01-13 ENCOUNTER — Other Ambulatory Visit: Payer: Self-pay

## 2021-01-29 ENCOUNTER — Ambulatory Visit (INDEPENDENT_AMBULATORY_CARE_PROVIDER_SITE_OTHER): Payer: Self-pay | Admitting: Podiatry

## 2021-01-29 ENCOUNTER — Other Ambulatory Visit: Payer: Self-pay

## 2021-01-29 ENCOUNTER — Encounter: Payer: Self-pay | Admitting: Podiatry

## 2021-01-29 DIAGNOSIS — M722 Plantar fascial fibromatosis: Secondary | ICD-10-CM

## 2021-02-04 NOTE — Progress Notes (Signed)
Subjective:  Patient ID: Christopher Schultz, male    DOB: [birth year],  MRN: [id number]  No chief complaint on file.   50 y.o. male presents with the above complaint.  Patient presents with follow-up to left Planter fasciitis.  He states that he still has pretty good amount of pain.  He has not gotten the much relief with the steroid injection.  He denies any other acute complaints.   Review of Systems: Negative except as noted in the HPI. Denies N/V/F/Ch.  Past Medical History:  Diagnosis Date   Hernia, abdominal    Hypertension 2019   Morbid obesity with BMI of 40.0-44.9, adult ([place])    Morbid obesity with BMI of 40.0-44.9, adult ([facility])    OSA (obstructive sleep apnea) 2004   OSA on CPAP    Pre-diabetes    Prediabetes 05/30/2017    Current Outpatient Medications:    amLODipine (NORVASC) 10 MG tablet, Take 1 tablet by mouth once daily, Disp: 30 tablet, Rfl: 0   atorvastatin (LIPITOR) 40 MG tablet, Take 1 tablet by mouth once daily, Disp: 30 tablet, Rfl: 9   cloNIDine (CATAPRES) 0.1 MG tablet, Take 2 tablets by mouth twice daily, Disp: 120 tablet, Rfl: 9   Diclofenac Sodium 1.5 % SOLN, Apply 2 g topically 4 (four) times daily as needed., Disp: 300 mL, Rfl: 2   glipiZIDE (GLUCOTROL) 5 MG tablet, TAKE 1 TABLET BY MOUTH TWICE DAILY BEFORE  A  MEAL, Disp: 60 tablet, Rfl: 9   ibuprofen (ADVIL) 800 MG tablet, Take 1 tablet (800 mg total) by mouth every 6 (six) hours as needed., Disp: 60 tablet, Rfl: 1   ID NOW COVID-19 KIT, See admin instructions., Disp: , Rfl:    losartan (COZAAR) 100 MG tablet, TAKE 1 TABLET BY MOUTH ONCE DAILY(STOP LISINOPRIL), Disp: 30 tablet, Rfl: 9   meloxicam (MOBIC) 15 MG tablet, Take 1 tablet (15 mg total) by mouth daily., Disp: 30 tablet, Rfl: 0   metFORMIN (GLUCOPHAGE) 1000 MG tablet, TAKE 1 TABLET BY MOUTH TWICE DAILY WITH MEALS, Disp: 180 tablet, Rfl: 0   oxyCODONE-acetaminophen (PERCOCET) 10-325 MG tablet, Take 1 tablet by mouth every 4 (four) hours as needed for  pain., Disp: 30 tablet, Rfl: 0   terbinafine (LAMISIL) 250 MG tablet, 1 tab by mouth daily for 84 days., Disp: 84 tablet, Rfl: 0  Social History   Tobacco Use  Smoking Status Never  Smokeless Tobacco Never    No Known Allergies Objective:  There were no vitals filed for this visit. There is no height or weight on file to calculate BMI. Constitutional Well developed. Well nourished.  Vascular Dorsalis pedis pulses palpable bilaterally. Posterior tibial pulses palpable bilaterally. Capillary refill normal to all digits.  No cyanosis or clubbing noted. Pedal hair growth normal.  Neurologic Normal speech. Oriented to person, place, and time. Epicritic sensation to light touch grossly present bilaterally.  Dermatologic Nails well groomed and normal in appearance. No open wounds. No skin lesions.  Orthopedic: Normal joint ROM without pain or crepitus bilaterally. No visible deformities. Tender to palpation at the calcaneal tuber left. No pain with calcaneal squeeze left. Ankle ROM diminished range of motion left. Silfverskiold Test: negative left.   Radiographs: None  Assessment:   1. Plantar fasciitis of left foot     Plan:  Patient was evaluated and treated and all questions answered.  Plantar Fasciitis, left with underlying heel spur -Continue taking meloxicam and Mobic for pain control. -PRP injection was delivered in standard technique  per protocol.  I will discuss with the patient about follow-up and further For injection during next clinical visit.

## 2021-03-05 ENCOUNTER — Ambulatory Visit: Payer: Self-pay | Admitting: Podiatry

## 2021-03-17 ENCOUNTER — Ambulatory Visit: Payer: Self-pay | Admitting: Podiatry

## 2021-03-17 ENCOUNTER — Other Ambulatory Visit: Payer: Self-pay

## 2021-03-24 ENCOUNTER — Other Ambulatory Visit: Payer: Self-pay

## 2021-03-24 ENCOUNTER — Ambulatory Visit (INDEPENDENT_AMBULATORY_CARE_PROVIDER_SITE_OTHER): Payer: Self-pay | Admitting: Podiatry

## 2021-03-24 ENCOUNTER — Encounter: Payer: Self-pay | Admitting: Podiatry

## 2021-03-24 DIAGNOSIS — M722 Plantar fascial fibromatosis: Secondary | ICD-10-CM

## 2021-03-30 NOTE — Progress Notes (Signed)
Subjective:  Patient ID: Christopher Schultz, male    DOB: April 01, 1971,  MRN: [id number]  No chief complaint on file.   50 y.o. male presents with the above complaint.  Patient presents with follow-up to left Planter fasciitis.  He no longer has any pain he has been able to ambulate in regular shoes without any issues.  The PRP injection helped considerably.   Review of Systems: Negative except as noted in the HPI. Denies N/V/F/Ch.  Past Medical History:  Diagnosis Date   Hernia, abdominal    Hypertension 2019   Morbid obesity with BMI of 40.0-44.9, adult ([place])    Morbid obesity with BMI of 40.0-44.9, adult ([facility])    OSA (obstructive sleep apnea) 2004   OSA on CPAP    Pre-diabetes    Prediabetes 05/30/2017    Current Outpatient Medications:    amLODipine (NORVASC) 10 MG tablet, Take 1 tablet by mouth once daily, Disp: 30 tablet, Rfl: 0   atorvastatin (LIPITOR) 40 MG tablet, Take 1 tablet by mouth once daily, Disp: 30 tablet, Rfl: 9   cloNIDine (CATAPRES) 0.1 MG tablet, Take 2 tablets by mouth twice daily, Disp: 120 tablet, Rfl: 9   Diclofenac Sodium 1.5 % SOLN, Apply 2 g topically 4 (four) times daily as needed., Disp: 300 mL, Rfl: 2   glipiZIDE (GLUCOTROL) 5 MG tablet, TAKE 1 TABLET BY MOUTH TWICE DAILY BEFORE  A  MEAL, Disp: 60 tablet, Rfl: 9   ibuprofen (ADVIL) 800 MG tablet, Take 1 tablet (800 mg total) by mouth every 6 (six) hours as needed., Disp: 60 tablet, Rfl: 1   ID NOW COVID-19 KIT, See admin instructions., Disp: , Rfl:    losartan (COZAAR) 100 MG tablet, TAKE 1 TABLET BY MOUTH ONCE DAILY(STOP LISINOPRIL), Disp: 30 tablet, Rfl: 9   meloxicam (MOBIC) 15 MG tablet, Take 1 tablet (15 mg total) by mouth daily., Disp: 30 tablet, Rfl: 0   metFORMIN (GLUCOPHAGE) 1000 MG tablet, TAKE 1 TABLET BY MOUTH TWICE DAILY WITH MEALS, Disp: 180 tablet, Rfl: 0   oxyCODONE-acetaminophen (PERCOCET) 10-325 MG tablet, Take 1 tablet by mouth every 4 (four) hours as needed for pain., Disp: 30 tablet,  Rfl: 0   terbinafine (LAMISIL) 250 MG tablet, 1 tab by mouth daily for 84 days., Disp: 84 tablet, Rfl: 0  Social History   Tobacco Use  Smoking Status Never  Smokeless Tobacco Never    No Known Allergies Objective:  There were no vitals filed for this visit. There is no height or weight on file to calculate BMI. Constitutional Well developed. Well nourished.  Vascular Dorsalis pedis pulses palpable bilaterally. Posterior tibial pulses palpable bilaterally. Capillary refill normal to all digits.  No cyanosis or clubbing noted. Pedal hair growth normal.  Neurologic Normal speech. Oriented to person, place, and time. Epicritic sensation to light touch grossly present bilaterally.  Dermatologic Nails well groomed and normal in appearance. No open wounds. No skin lesions.  Orthopedic: Normal joint ROM without pain or crepitus bilaterally. No visible deformities. No further tender to palpation at the calcaneal tuber left. No pain with calcaneal squeeze left. Ankle ROM diminished range of motion left. Silfverskiold Test: negative left.   Radiographs: None  Assessment:   1. Plantar fasciitis of left foot      Plan:  Patient was evaluated and treated and all questions answered.  Plantar Fasciitis, left with underlying heel spur -Clinically patient's pain has resolved considerably after undergoing a PRP injection once.  I discussed resume regular activities  and if it recurs we can discuss further PRP injection.  He states understanding

## 2021-04-09 ENCOUNTER — Other Ambulatory Visit: Payer: Self-pay | Admitting: Internal Medicine

## 2021-04-12 ENCOUNTER — Other Ambulatory Visit: Payer: Self-pay

## 2021-04-12 MED ORDER — GLIPIZIDE 5 MG PO TABS: 5.0000 mg | ORAL_TABLET | Freq: Two times a day (BID) | ORAL | 0 refills | Status: DC

## 2021-04-12 MED ORDER — METFORMIN HCL 1000 MG PO TABS: ORAL_TABLET | ORAL | 0 refills | Status: DC

## 2021-04-12 MED ORDER — AMLODIPINE BESYLATE 10 MG PO TABS: 10.0000 mg | ORAL_TABLET | Freq: Every day | ORAL | 0 refills | Status: DC

## 2021-05-07 ENCOUNTER — Emergency Department (HOSPITAL_COMMUNITY): Payer: Self-pay

## 2021-05-07 ENCOUNTER — Other Ambulatory Visit: Payer: Self-pay

## 2021-05-07 ENCOUNTER — Emergency Department (HOSPITAL_COMMUNITY)
Admission: EM | Admit: 2021-05-07 | Discharge: 2021-05-07 | Disposition: A | Discharge status: Home / Self Care | Payer: Self-pay | Attending: Emergency Medicine | Admitting: Emergency Medicine

## 2021-05-07 ENCOUNTER — Encounter (HOSPITAL_COMMUNITY): Payer: Self-pay

## 2021-05-07 DIAGNOSIS — I10 Essential (primary) hypertension: Secondary | ICD-10-CM | POA: Insufficient documentation

## 2021-05-07 DIAGNOSIS — G8929 Other chronic pain: Secondary | ICD-10-CM | POA: Insufficient documentation

## 2021-05-07 DIAGNOSIS — E1165 Type 2 diabetes mellitus with hyperglycemia: Secondary | ICD-10-CM | POA: Insufficient documentation

## 2021-05-07 DIAGNOSIS — Z7984 Long term (current) use of oral hypoglycemic drugs: Secondary | ICD-10-CM | POA: Insufficient documentation

## 2021-05-07 DIAGNOSIS — M25562 Pain in left knee: Secondary | ICD-10-CM | POA: Insufficient documentation

## 2021-05-07 DIAGNOSIS — Z79899 Other long term (current) drug therapy: Secondary | ICD-10-CM | POA: Insufficient documentation

## 2021-05-07 MED ORDER — MELOXICAM 7.5 MG PO TABS: 7.5000 mg | ORAL_TABLET | Freq: Every day | ORAL | 0 refills | Status: AC

## 2021-05-07 MED ORDER — LIDOCAINE 5 % EX PTCH: 1.0000 | MEDICATED_PATCH | CUTANEOUS | 0 refills | Status: AC

## 2021-05-07 MED ORDER — KETOROLAC TROMETHAMINE 30 MG/ML IJ SOLN
30.0000 mg | Freq: Once | INTRAMUSCULAR | Status: AC
  Administered 2021-05-07: 30 mg via INTRAMUSCULAR
  Filled 2021-05-07: qty 1

## 2021-05-07 NOTE — ED Provider Notes (Addendum)
[facility] DEPT Provider Note   CSN: [id number] Arrival date & time: 05/07/21  0086    History Chief Complaint  Patient presents with   Knee Pain    Christopher Schultz is a 50 y.o. male with no significant past medical history who presents for evaluation of left knee pain has had left knee pain x1 month.  Worse when he walks.  Pain located to frontal aspect knee just below patella.  No known injury.  No radiation of pain.  Pain improves when he is not ambulatory.  Does walk, stand and do stairs a lot at work.  No pain to bilateral calves.  No radicular symptoms.  Denies any redness, swelling, warmth.  No chest pain, shortness of breath.  No history of PE or DVT.  Did state he had surgery for plantar fasciitis however this was almost 1 year ago.  No pain to this area.  He is not currently followed by Ortho.  No GU symptoms.  No history of gout.  No recent surgery, immobilization.  Denies additional aggravating or alleviating factors. Taking Ibuprofen for pain.  History obtained from patient and past medical records.  No interpreter is used  HPI     Past Medical History:  Diagnosis Date   Hernia, abdominal    Hypertension 2019   Morbid obesity with BMI of 40.0-44.9, adult ([place])    Morbid obesity with BMI of 40.0-44.9, adult ([place])    OSA (obstructive sleep apnea) 2004   OSA on CPAP    Pre-diabetes    Prediabetes 05/30/2017    Patient Active Problem List   Diagnosis Date Noted   Left foot pain 12/24/2019   Uncontrolled type 2 diabetes mellitus with hyperglycemia ([place]) 12/24/2019   Tinea pedis of both feet 09/06/2019   Onychomycosis 09/06/2019   Mixed hyperlipidemia 02/05/2019   OSA (obstructive sleep apnea)    Essential hypertension 08/12/2017   Morbid obesity with BMI of 40.0-44.9, adult ([place]) 08/12/2017   Acute hyperglycemia 76/19/5093   Hernia, umbilical 26/71/2458   Umbilical hernia with obstruction 08/11/2017   Prediabetes 05/30/2017    Past  Surgical History:  Procedure Laterality Date   ABDOMINAL HERNIA REPAIR  09/98/3382   umbilical herina repair for incarceration   EYE SURGERY     KNEE SURGERY     left   plantar fiscitis     UMBILICAL HERNIA REPAIR  08/11/2017   Incarcerated hernia   UMBILICAL HERNIA REPAIR N/A 08/11/2017   Procedure: HERNIA REPAIR UMBILICAL ADULT OPEN;  Surgeon: Kinsinger, Arta Bruce, MD;  Location: [facility];  Service: General;  Laterality: N/A;       Family History  Problem Relation Age of Onset   Hypertension Mother    Fibromyalgia Mother    Diabetes Father    Hypertension Father    Peripheral Artery Disease Father    Hypertension Brother     Social History   Tobacco Use   Smoking status: Never   Smokeless tobacco: Never  Vaping Use   Vaping Use: Never used  Substance Use Topics   Alcohol use: Yes    Comment: ocassionally   Drug use: Yes    Types: Marijuana    Home Medications Prior to Admission medications   Medication Sig Start Date End Date Taking? Authorizing Provider  lidocaine (LIDODERM) 5 % Place 1 patch onto the skin daily. Remove & Discard patch within 12 hours or as directed by MD 05/07/21  Yes Henderly, Britni A, PA-C  meloxicam (MOBIC)  7.5 MG tablet Take 1 tablet (7.5 mg total) by mouth daily. 05/07/21  Yes Henderly, Britni A, PA-C  amLODipine (NORVASC) 10 MG tablet Take 1 tablet (10 mg total) by mouth daily. 04/12/21   Mack Hook, MD  atorvastatin (LIPITOR) 40 MG tablet Take 1 tablet by mouth once daily 03/29/20   Mack Hook, MD  cloNIDine (CATAPRES) 0.1 MG tablet Take 2 tablets by mouth twice daily 04/19/21   Mack Hook, MD  Diclofenac Sodium 1.5 % SOLN Apply 2 g topically 4 (four) times daily as needed. 09/18/19   Mack Hook, MD  glipiZIDE (GLUCOTROL) 5 MG tablet Take 1 tablet (5 mg total) by mouth 2 (two) times daily before a meal. 04/12/21   Mack Hook, MD  ibuprofen (ADVIL) 800 MG tablet Take 1 tablet (800 mg total) by mouth  every 6 (six) hours as needed. 10/23/20   Felipa Furnace, DPM  ID NOW COVID-19 KIT See admin instructions. 08/26/19   [provider]  losartan (COZAAR) 100 MG tablet TAKE 1 TABLET BY MOUTH ONCE DAILY(STOP LISINOPRIL) 04/12/21   Mack Hook, MD  metFORMIN (GLUCOPHAGE) 1000 MG tablet TAKE 1 TABLET BY MOUTH TWICE DAILY WITH MEALS 04/12/21   Mack Hook, MD  oxyCODONE-acetaminophen (PERCOCET) 10-325 MG tablet Take 1 tablet by mouth every 4 (four) hours as needed for pain. 04/29/20   Felipa Furnace, DPM  terbinafine (LAMISIL) 250 MG tablet 1 tab by mouth daily for 84 days. 11/05/19   Mack Hook, MD    Allergies    Patient has no known allergies.  Review of Systems   Review of Systems  Constitutional: Negative.  Negative for chills and fever.  HENT: Negative.  Negative for ear pain and sore throat.   Eyes:  Negative for pain and visual disturbance.  Respiratory: Negative.  Negative for cough and shortness of breath.   Cardiovascular: Negative.  Negative for chest pain and palpitations.  Gastrointestinal: Negative.  Negative for abdominal pain and vomiting.  Genitourinary: Negative.  Negative for dysuria and hematuria.  Musculoskeletal:  Negative for arthralgias and back pain.       Left knee pain  Skin: Negative.  Negative for color change and rash.  Neurological: Negative.  Negative for seizures and syncope.  All other systems reviewed and are negative.  Physical Exam Updated Vital Signs BP (!) 150/98 (BP Location: Left Arm)   Pulse 92   Temp 97.9 F (36.6 C) (Oral)   Resp 18   Ht 6' (1.829 m)   Wt (!) 142.9 kg   SpO2 100%   BMI 42.72 kg/m   Physical Exam Vitals and nursing note reviewed.  Constitutional:      General: He is not in acute distress.    Appearance: He is well-developed. He is not ill-appearing, toxic-appearing or diaphoretic.  HENT:     Head: Normocephalic and atraumatic.  Eyes:     Pupils: Pupils are equal, round, and reactive to  light.  Cardiovascular:     Rate and Rhythm: Normal rate and regular rhythm.     Pulses: Normal pulses.          Dorsalis pedis pulses are 2+ on the left side.       Posterior tibial pulses are 2+ on the left side.     Heart sounds: Normal heart sounds.  Pulmonary:     Effort: Pulmonary effort is normal. No respiratory distress.  Abdominal:     General: There is no distension.     Palpations: Abdomen  is soft.  Musculoskeletal:        General: Normal range of motion.     Cervical back: Normal range of motion and neck supple.     Comments: Compartments soft, no bony tenderness of bilateral femur, tib-fib.  Able to plantarflex and dorsiflex without difficulty.  Able to flex and extend at bilateral knees.  Diffuse tenderness about left anterior medial aspect knee.  Negative anterior drawer.  Bevelyn Buckles' sign negative.  No obvious joint effusion  Skin:    General: Skin is warm and dry.     Capillary Refill: Capillary refill takes less than 2 seconds.     Comments: No edema, erythema or warmth.  No fluctuance or induration.  Neurological:     General: No focal deficit present.     Mental Status: He is alert and oriented to person, place, and time.     Sensory: Sensation is intact.     Motor: Motor function is intact.     Gait: Gait is intact.     Comments: Intact sensation Ambulatory Equal strength bilaterally    ED Results / Procedures / Treatments   Labs (all labs ordered are listed, but only abnormal results are displayed) Labs Reviewed - No data to display  EKG None  Radiology DG Knee Complete 4 Views Left  Result Date: 05/07/2021 CLINICAL DATA:  Pain left knee EXAM: LEFT KNEE - COMPLETE 4+ VIEW COMPARISON:  None. FINDINGS: No fracture or dislocation is seen. Degenerative changes are noted with small bony spurs in medial, lateral and patellofemoral compartments. There is no significant joint effusion. There are surgical screws in the distal femur and proximal tibia suggesting  previous cruciate ligament repair. IMPRESSION: No recent fracture or dislocation is seen in the left knee. Postsurgical changes are noted. Degenerative changes are noted with bony spurs. Electronically Signed   By: Elmer Picker M.D.   On: 05/07/2021 10:20    Procedures .Ortho Injury Treatment  Date/Time: 05/07/2021 12:54 PM Performed by: Shelby Dubin A, PA-C Authorized by: Nettie Elm, PA-C   Consent:    Consent obtained:  Verbal   Consent given by:  Patient   Risks discussed:  Fracture, recurrent dislocation, restricted joint movement, vascular damage, stiffness, nerve damage and irreducible dislocation   Alternatives discussed:  No treatment, alternative treatment, immobilization, referral and delayed treatmentInjury location: knee Location details: left knee Injury type: soft tissue Pre-procedure neurovascular assessment: neurovascularly intact Pre-procedure distal perfusion: normal Pre-procedure neurological function: normal Pre-procedure range of motion: normal  Anesthesia: Local anesthesia used: no  Patient sedated: NoImmobilization: brace Splint type: knee sleeve. Splint Applied by: Sheliah Hatch Post-procedure neurovascular assessment: post-procedure neurovascularly intact Post-procedure distal perfusion: normal Post-procedure neurological function: normal Post-procedure range of motion: normal     Medications Ordered in ED Medications  ketorolac (TORADOL) 30 MG/ML injection 30 mg (has no administration in time range)    ED Course  I have reviewed the triage vital signs and the nursing notes.  Pertinent labs & imaging results that were available during my care of the patient were reviewed by me and considered in my medical decision making (see chart for details).  Here for evaluation of 1 month of atraumatic knee pain.  He is afebrile, nonseptic, not ill-appearing.  He has no systemic symptoms.  No overlying redness, warmth, effusion.  Low suspicion  for septic joint.  No history of gout.  No radicular symptoms.  No bony tenderness to femur, tib-fib.  Does have some pain diffusely to anterior inferior knee  as well as over medial joint line.  He has full range of motion.  N/V intact.  X-ray personally viewed and interpreted does not show any evidence of fracture, dislocation, effusion.  Does show some bone spurs.  Suspect some degree of osteoarthritis, bone spurs as cause of his pain.  We will switch to Mobic, no history of GI bleeds, CKD.  Placed in knee sleeve for comfort, stressed importance of follow-up with orthopedics.  Patient agreeable.  At this time I low suspicion for gout, septic joint, hemarthrosis, occult fracture, dislocation, acute arterial ischemic injury, VTE, rhabdomyolysis, myositis  The patient has been appropriately medically screened and/or stabilized in the ED. I have low suspicion for any other emergent medical condition which would require further screening, evaluation or treatment in the ED or require inpatient management.  Patient is hemodynamically stable and in no acute distress.  Patient able to ambulate in department prior to ED.  Evaluation does not show acute pathology that would require ongoing or additional emergent interventions while in the emergency department or further inpatient treatment.  I have discussed the diagnosis with the patient and answered all questions.  Pain is been managed while in the emergency department and patient has no further complaints prior to discharge.  Patient is comfortable with plan discussed in room and is stable for discharge at this time.  I have discussed strict return precautions for returning to the emergency department.  Patient was encouraged to follow-up with PCP/specialist refer to at discharge.     MDM Rules/Calculators/A&P                            Final Clinical Impression(s) / ED Diagnoses Final diagnoses:  Chronic pain of left knee    Rx / DC Orders ED  Discharge Orders          Ordered    meloxicam (MOBIC) 7.5 MG tablet  Daily        05/07/21 1248    lidocaine (LIDODERM) 5 %  Every 24 hours        05/07/21 1248             Henderly, Britni A, PA-C 05/07/21 1254    Henderly, Britni A, PA-C 05/07/21 1255    Charlesetta Shanks, MD 05/07/21 1603

## 2021-05-07 NOTE — ED Triage Notes (Signed)
Patient c/o left knee pain x 1 1/2 months. Patient denies any injury.

## 2021-05-07 NOTE — Progress Notes (Signed)
Orthopedic Tech Progress Note Patient Details:  Christopher Schultz 1971/01/26 [id number]  Ortho Devices Type of Ortho Device: Knee Sleeve Ortho Device/Splint Location: Left Knee Ortho Device/Splint Interventions: Application   Post Interventions Patient Tolerated: Well  [name] 05/07/2021, 12:48 PM

## 2021-05-07 NOTE — ED Provider Notes (Signed)
Emergency Medicine Provider Triage Evaluation Note  SANI MADARIAGA , a 50 y.o. male  was evaluated in triage.  Pt complains of left knee pain x 1 month. Worse over the last week. No known injury. No radiation of pain. No redness, warmth, numbness, weakness. Ambulatory here.  Review of Systems  Positive: Left knee pain Negative: Numbness, weakness  Physical Exam  There were no vitals taken for this visit. Gen:   Awake, no distress   Resp:  Normal effort  MSK:   Moves extremities without difficulty, Tenderness to medial aspect right knee. No bony tenderness to femur, tib/fib Other:    Medical Decision Making  Medically screening exam initiated at 9:49 AM.  Appropriate orders placed.  Angelyn Punt was informed that the remainder of the evaluation will be completed by another provider, this initial triage assessment does not replace that evaluation, and the importance of remaining in the ED until their evaluation is complete.  Left knee pain   Madalyne Husk A, PA-C 05/07/21 0950    Arby Barrette, MD 05/07/21 1032

## 2021-05-07 NOTE — Discharge Instructions (Addendum)
Take the Mobic as prescribed.  You may also use lidocaine patches.  These patches need to be on for 12 hours, remove for 12 hours before placing additional patch.  Follow-up with orthopedics.  I have placed the contact information to on your discharge paperwork.  If you have your own personal orthopedist you may call them as well.  Return for new or worsening symptoms.

## 2021-05-20 ENCOUNTER — Other Ambulatory Visit: Payer: Self-pay

## 2021-05-20 MED ORDER — LOSARTAN POTASSIUM 100 MG PO TABS: ORAL_TABLET | ORAL | 0 refills | Status: DC

## 2021-05-20 MED ORDER — AMLODIPINE BESYLATE 10 MG PO TABS: 10.0000 mg | ORAL_TABLET | Freq: Every day | ORAL | 0 refills | Status: DC

## 2021-05-26 ENCOUNTER — Ambulatory Visit: Payer: Self-pay | Admitting: Internal Medicine

## 2021-06-28 ENCOUNTER — Other Ambulatory Visit: Payer: Self-pay

## 2021-06-28 ENCOUNTER — Encounter: Payer: Self-pay | Admitting: Internal Medicine

## 2021-06-28 ENCOUNTER — Ambulatory Visit: Payer: Self-pay | Admitting: Internal Medicine

## 2021-06-28 VITALS — BP 144/104 | HR 96 | Resp 20 | Ht 70.0 in | Wt 326.0 lb

## 2021-06-28 DIAGNOSIS — Z23 Encounter for immunization: Secondary | ICD-10-CM

## 2021-06-28 DIAGNOSIS — Z125 Encounter for screening for malignant neoplasm of prostate: Secondary | ICD-10-CM

## 2021-06-28 DIAGNOSIS — Z79899 Other long term (current) drug therapy: Secondary | ICD-10-CM

## 2021-06-28 DIAGNOSIS — I10 Essential (primary) hypertension: Secondary | ICD-10-CM

## 2021-06-28 DIAGNOSIS — E1165 Type 2 diabetes mellitus with hyperglycemia: Secondary | ICD-10-CM

## 2021-06-28 DIAGNOSIS — Z1159 Encounter for screening for other viral diseases: Secondary | ICD-10-CM

## 2021-06-28 DIAGNOSIS — H547 Unspecified visual loss: Secondary | ICD-10-CM

## 2021-06-28 DIAGNOSIS — E782 Mixed hyperlipidemia: Secondary | ICD-10-CM

## 2021-06-28 DIAGNOSIS — Z6841 Body Mass Index (BMI) 40.0 and over, adult: Secondary | ICD-10-CM

## 2021-06-28 MED ORDER — GLIPIZIDE ER 10 MG PO TB24: 10.0000 mg | ORAL_TABLET | Freq: Every day | ORAL | 11 refills | Status: DC

## 2021-06-28 MED ORDER — AMLODIPINE BESYLATE 10 MG PO TABS: 10.0000 mg | ORAL_TABLET | Freq: Every day | ORAL | 11 refills | Status: DC

## 2021-06-28 MED ORDER — LOSARTAN POTASSIUM 100 MG PO TABS: ORAL_TABLET | ORAL | 11 refills | Status: DC

## 2021-06-28 MED ORDER — CLONIDINE 0.2 MG/24HR TD PTWK: 0.2000 mg | MEDICATED_PATCH | TRANSDERMAL | 11 refills | Status: DC

## 2021-06-28 MED ORDER — ATORVASTATIN CALCIUM 40 MG PO TABS: ORAL_TABLET | ORAL | 11 refills | Status: DC

## 2021-06-28 MED ORDER — METFORMIN HCL ER 750 MG PO TB24: ORAL_TABLET | ORAL | 11 refills | Status: DC

## 2021-06-28 NOTE — Patient Instructions (Signed)
Get into a pool and make weekly goals for gradual increase in physical activity daily!!!! Aquatic center or scholarship for the [facility]  Drink a glass of water before every meal Drink 6-8 glasses of water daily Eat three meals daily Eat a protein and healthy fat with every meal (eggs,fish, chicken, [place] and limit red meats) Eat 5 servings of vegetables daily, mix the colors Eat 2 servings of fruit daily with skin, if skin is edible Use smaller plates Put food/utensils down as you chew and swallow each bite Eat at a table with friends/family at least once daily, no TV Do not eat in front of the TV  Recent studies show that people who consume all of their calories in a 12 hour period lose weight more efficiently.  For example, if you eat your first meal at 7:00 a.m., your last meal of the day should be completed by 7:00 p.m.   Call if you do not hear from [facility] in next week

## 2021-06-28 NOTE — Progress Notes (Signed)
Subjective:    Patient ID: Christopher Schultz, male   DOB: May 25, 1971, 51 y.o.   MRN: [id number]   HPI Has not been seen since July 2021 Wife had surgery and missed work a lot so ultimately had to find another [employer]. Has had significant and leg surgery.     DM:  Not checking sugars.  Misses evening medications frequently.  2.  Hypertension:  not clear how often he may have been missing meds.  Suspect stretching meds.  He will have [employer] soon. Would be willing to use a patch for the Catapres--has been missing pm dosing.    3.  Hyperlipidemia:  Atorvastatin.  Again, missing doses.    4.  OSA:  Never able to get CPAP set up.  Does have the mask, but never was set up with machine--played phone tag with the company.  5.  Poor vision:  mentions this when asked if has had eye exam in past year (no to the latter)  Current Meds  Medication Sig   amLODipine (NORVASC) 10 MG tablet Take 1 tablet (10 mg total) by mouth daily.   atorvastatin (LIPITOR) 40 MG tablet Take 1 tablet by mouth once daily (Patient taking differently: Started back about 3 weeks ago)   cloNIDine (CATAPRES) 0.1 MG tablet Take 2 tablets by mouth twice daily   glipiZIDE (GLUCOTROL) 5 MG tablet Take 1 tablet (5 mg total) by mouth 2 (two) times daily before a meal.   ibuprofen (ADVIL) 800 MG tablet Take 1 tablet (800 mg total) by mouth every 6 (six) hours as needed.   lidocaine (LIDODERM) 5 % Place 1 patch onto the skin daily. Remove & Discard patch within 12 hours or as directed by MD   losartan (COZAAR) 100 MG tablet TAKE 1 TABLET BY MOUTH ONCE DAILY(STOP LISINOPRIL)   meloxicam (MOBIC) 7.5 MG tablet Take 1 tablet (7.5 mg total) by mouth daily. (Patient taking differently: Take 7.5 mg by mouth as needed.)   metFORMIN (GLUCOPHAGE) 1000 MG tablet TAKE 1 TABLET BY MOUTH TWICE DAILY WITH MEALS (Patient taking differently: TAKE 1 TABLET BY MOUTH TWICE DAILY WITH MEALS. Started back about 2 weeks ago)   No  Known Allergies   Review of Systems  Constitutional:        Has gained a lot of weight with lack of physical activity and surgery on foot.  Respiratory:  Negative for shortness of breath.   Cardiovascular:  Negative for chest pain, palpitations and leg swelling.       No PND or orthopnea symptoms.    Neurological:  Negative for weakness and numbness.     Objective:   BP (!) 144/104 (BP Location: Right Arm, Patient Position: Sitting, Cuff Size: Normal)    Pulse 96    Resp 20    Ht 5\' 10"  (1.778 m)    Wt (!) 326 lb (147.9 kg)    BMI 46.78 kg/m   Physical Exam HEENT:  PERRL, EOMI Neck:  Supple, No adenopathy Chest:  CTA CV:  RRR with normal S1 and S2, No S3, S4 or murmur.  Carotid, radial and DP pulses normal and equal. Abd:  obese, NT, No HSM or mass, + BS LE:  No edema.  Assessment & Plan    DM with obesity:  missing evening meds in general, not just for DM.  Switch to SR preparation at 750 mg 2 tabs daily in morning.  Work on diet and get into a pool for physical  activity.  2.  Hypertension:  not controlled likely as missing meds:  change to patch for catapres and continue 24 hour preps of ARB and amlodipine.  3.  Hyperlipidemia:  Atorvastatin in the morning.    4.  OSA:  send previous order for CPAP equipment to [facility].  5.  HM:  Moderna bivalent.  Encouraged influenza vaccine at pharmacy of choice.  PSA, hep C screening.  Other labs:  CBC, CMP, A1C, FLP, urine microalbumin/crea

## 2021-06-29 LAB — COMPREHENSIVE METABOLIC PANEL
ALT: 21 IU/L (ref 0–44)
AST: 12 IU/L (ref 0–40)
Albumin/Globulin Ratio: 1.7 (ref 1.2–2.2)
Albumin: 4.3 g/dL (ref 4.0–5.0)
Alkaline Phosphatase: 96 IU/L (ref 44–121)
BUN/Creatinine Ratio: 11 (ref 9–20)
BUN: 13 mg/dL (ref 6–24)
Bilirubin Total: 0.2 mg/dL (ref 0.0–1.2)
CO2: 23 mmol/L (ref 20–29)
Calcium: 9.4 mg/dL (ref 8.7–10.2)
Chloride: 105 mmol/L (ref 96–106)
Creatinine, Ser: 1.17 mg/dL (ref 0.76–1.27)
Globulin, Total: 2.6 g/dL (ref 1.5–4.5)
Glucose: 249 mg/dL — ABNORMAL HIGH (ref 70–99)
Potassium: 4.8 mmol/L (ref 3.5–5.2)
Sodium: 141 mmol/L (ref 134–144)
Total Protein: 6.9 g/dL (ref 6.0–8.5)
eGFR: 76 mL/min/{1.73_m2} (ref >59)

## 2021-06-29 LAB — LIPID PANEL W/O CHOL/HDL RATIO
Cholesterol, Total: 200 mg/dL — ABNORMAL HIGH (ref 100–199)
HDL: 30 mg/dL — ABNORMAL LOW (ref >39)
LDL Chol Calc (NIH): 135 mg/dL — ABNORMAL HIGH (ref 0–99)
Triglycerides: 191 mg/dL — ABNORMAL HIGH (ref 0–149)
VLDL Cholesterol Cal: 35 mg/dL (ref 5–40)

## 2021-06-29 LAB — CBC WITH DIFFERENTIAL/PLATELET
Basophils Absolute: 0 10*3/uL (ref 0.0–0.2)
Basos: 1 %
EOS (ABSOLUTE): 0 10*3/uL (ref 0.0–0.4)
Eos: 1 %
Hematocrit: 48.5 % (ref 37.5–51.0)
Hemoglobin: 15.8 g/dL (ref 13.0–17.7)
Immature Grans (Abs): 0 10*3/uL (ref 0.0–0.1)
Immature Granulocytes: 0 %
Lymphocytes Absolute: 1.4 10*3/uL (ref 0.7–3.1)
Lymphs: 24 %
MCH: 28.8 pg (ref 26.6–33.0)
MCHC: 32.6 g/dL (ref 31.5–35.7)
MCV: 89 fL (ref 79–97)
Monocytes Absolute: 0.4 10*3/uL (ref 0.1–0.9)
Monocytes: 8 %
Neutrophils Absolute: 3.8 10*3/uL (ref 1.4–7.0)
Neutrophils: 66 %
Platelets: 318 10*3/uL (ref 150–450)
RBC: 5.48 x10E6/uL (ref 4.14–5.80)
RDW: 12.9 % (ref 11.6–15.4)
WBC: 5.7 10*3/uL (ref 3.4–10.8)

## 2021-06-29 LAB — MICROALBUMIN / CREATININE URINE RATIO
Creatinine, Urine: 289.4 mg/dL
Microalb/Creat Ratio: 25 mg/g creat (ref 0–29)
Microalbumin, Urine: 71.5 ug/mL

## 2021-06-29 LAB — HEPATITIS C ANTIBODY: Hep C Virus Ab: <0.1 s/co ratio (ref 0.0–0.9)

## 2021-06-29 LAB — PSA: Prostate Specific Ag, Serum: 0.9 ng/mL (ref 0.0–4.0)

## 2021-06-29 LAB — HGB A1C W/O EAG: Hgb A1c MFr Bld: 10.4 % — ABNORMAL HIGH (ref 4.8–5.6)

## 2021-06-30 ENCOUNTER — Other Ambulatory Visit: Payer: Self-pay

## 2021-06-30 ENCOUNTER — Encounter: Payer: Self-pay | Admitting: Podiatry

## 2021-06-30 ENCOUNTER — Ambulatory Visit (INDEPENDENT_AMBULATORY_CARE_PROVIDER_SITE_OTHER): Payer: Self-pay | Admitting: Podiatry

## 2021-06-30 DIAGNOSIS — M792 Neuralgia and neuritis, unspecified: Secondary | ICD-10-CM

## 2021-06-30 NOTE — Progress Notes (Signed)
Subjective:  Patient ID: Christopher Schultz, male    DOB: Oct 05, 1970,  MRN: [id number]  Chief Complaint  Patient presents with   Plantar Fasciitis    Pt stated that he is starting to have some pain in his left leg since he went back to work     51 y.o. male presents with the above complaint.  Patient presents with complaint of left leg numbness tingling when he has been standing on his feet for long period of time.  He is a diabetic his A1c is at 10.4%.  He states that he gets a shooting pain up the leg when he is on his feet and doing a lot of work.  He states that his pain started when he went back to work.  His heel pain is doing great.  He wanted to get it evaluated.  He went to make sure there is nothing else going on.  He does not have any calf pain.   Review of Systems: Negative except as noted in the HPI. Denies N/V/F/Ch.  Past Medical History:  Diagnosis Date   Hernia, abdominal    Hypertension 2019   Morbid obesity with BMI of 40.0-44.9, adult ([facility])    Morbid obesity with BMI of 40.0-44.9, adult ([facility])    OSA (obstructive sleep apnea) 2004   OSA on CPAP    Pre-diabetes    Prediabetes 05/30/2017    Current Outpatient Medications:    amLODipine (NORVASC) 10 MG tablet, Take 1 tablet (10 mg total) by mouth daily., Disp: 30 tablet, Rfl: 11   atorvastatin (LIPITOR) 40 MG tablet, 1 tab by mouth daily in morning, Disp: 30 tablet, Rfl: 11   cloNIDine (CATAPRES - DOSED IN MG/24 HR) 0.2 mg/24hr patch, Place 1 patch (0.2 mg total) onto the skin once a week., Disp: 4 patch, Rfl: 11   Diclofenac Sodium 1.5 % SOLN, Apply 2 g topically 4 (four) times daily as needed. (Patient not taking: Reported on 06/28/2021), Disp: 300 mL, Rfl: 2   glipiZIDE (GLUCOTROL XL) 10 MG 24 hr tablet, Take 1 tablet (10 mg total) by mouth daily with breakfast., Disp: 30 tablet, Rfl: 11   lidocaine (LIDODERM) 5 %, Place 1 patch onto the skin daily. Remove & Discard patch within 12 hours or as directed by MD, Disp: 30  patch, Rfl: 0   losartan (COZAAR) 100 MG tablet, TAKE 1 TABLET BY MOUTH ONCE DAILY(STOP LISINOPRIL), Disp: 30 tablet, Rfl: 11   meloxicam (MOBIC) 7.5 MG tablet, Take 1 tablet (7.5 mg total) by mouth daily. (Patient taking differently: Take 7.5 mg by mouth as needed.), Disp: 30 tablet, Rfl: 0   metFORMIN (GLUCOPHAGE XR) 750 MG 24 hr tablet, 2 tabs by mouth with breakfast daily., Disp: 60 tablet, Rfl: 11  Social History   Tobacco Use  Smoking Status Never  Smokeless Tobacco Never    No Known Allergies Objective:  There were no vitals filed for this visit. There is no height or weight on file to calculate BMI. Constitutional Well developed. Well nourished.  Vascular Dorsalis pedis pulses palpable bilaterally. Posterior tibial pulses palpable bilaterally. Capillary refill normal to all digits.  No cyanosis or clubbing noted. Pedal hair growth normal.  Neurologic Normal speech. Oriented to person, place, and time. Epicritic sensation to light touch grossly present bilaterally.  Dermatologic Nails well groomed and normal in appearance. No open wounds. No skin lesions.  Orthopedic: Positive Tinel's sign with sural nerve compression.  This may likely be attributed by scar tissue formation.  Good range of motion noted at the ankle joint.  No equinus present.  No pain at the plantar fascia site.  No calf pain.  No concern for DVT   Radiographs: None Assessment:   1. Neuritis    Plan:  Patient was evaluated and treated and all questions answered.  Left leg neuritis/sural nerve compression -All questions and concerns were discussed with the patient in extensive detail. -His pain is very sporadic however it could likely be attributed to being on his feet for long period of time.  At this time I discussed with the patient that he could benefit from gabapentin.  I discussed in extensive detail the importance of stretching and taking it easy.  He is also going to work on changing his  occupation to take the stress off of the foot. -He can continue working in a light-duty fashion. -Gabapentin was dispensed to help with the nerve pain.  No follow-ups on file.

## 2021-07-12 ENCOUNTER — Ambulatory Visit: Payer: Self-pay

## 2021-07-12 ENCOUNTER — Other Ambulatory Visit: Payer: Self-pay

## 2021-07-12 VITALS — BP 140/108 | HR 92

## 2021-07-12 DIAGNOSIS — Z0130 Encounter for examination of blood pressure without abnormal findings: Secondary | ICD-10-CM

## 2021-07-12 MED ORDER — CARVEDILOL 3.125 MG PO TABS: 3.1250 mg | ORAL_TABLET | Freq: Two times a day (BID) | ORAL | 11 refills | Status: DC

## 2021-07-12 NOTE — Progress Notes (Unsigned)
Have him start Carvedilol 3.125 mg twice daily and come back for bp check and pulse in 2 weeks.  Call if problems

## 2021-07-12 NOTE — Progress Notes (Signed)
Patient report that he has been taking bp medication consistently. Patient also took bp medication this morning.

## 2021-08-09 ENCOUNTER — Telehealth: Payer: Self-pay

## 2021-08-09 ENCOUNTER — Other Ambulatory Visit: Payer: Self-pay

## 2021-08-09 ENCOUNTER — Ambulatory Visit: Payer: Self-pay

## 2021-08-09 VITALS — BP 144/100 | HR 88

## 2021-08-09 DIAGNOSIS — Z0130 Encounter for examination of blood pressure without abnormal findings: Secondary | ICD-10-CM

## 2021-08-09 MED ORDER — CLONIDINE HCL 0.1 MG PO TABS: 0.1000 mg | ORAL_TABLET | Freq: Two times a day (BID) | ORAL | 11 refills | Status: DC

## 2021-08-09 NOTE — Progress Notes (Signed)
? ? ?  Subjective:  ?  ?Patient ID: Christopher Schultz, male   DOB: 07-10-1970, 51 y.o.   MRN: [id number] ? ? ?HPI ? ?Current Meds  ?Medication Sig  ? cloNIDine (CATAPRES) 0.1 MG tablet Take 1 tablet (0.1 mg total) by mouth 2 (two) times daily.  ? ?No Known Allergies ? ? ?Review of Systems ? ? ? ?Objective:  ? ?BP (!) 144/100 (BP Location: Left Arm, Patient Position: Sitting, Cuff Size: Normal)   Pulse 88  ? ?Physical Exam ? ? ?Assessment & Plan  ? ?Switched to Catapres tabs as less expensive.  Needs BP and pulse check in 1 month. ? ? ?

## 2021-08-09 NOTE — Progress Notes (Signed)
Patient reported that he is taking bp medication without missing except for clonidine. He recently ran out of clonidine patches and wanted to wait to get clonidine pills before getting refills. ?

## 2021-08-09 NOTE — Telephone Encounter (Signed)
Patient would like to go back to Clonidine pills instead of patches. Patches are more expensive than the pills. ?

## 2021-08-11 ENCOUNTER — Ambulatory Visit: Payer: Self-pay | Admitting: Podiatry

## 2021-08-17 NOTE — Telephone Encounter (Signed)
Patient has been notified of rx change 

## 2021-08-20 ENCOUNTER — Ambulatory Visit (INDEPENDENT_AMBULATORY_CARE_PROVIDER_SITE_OTHER): Payer: Self-pay | Admitting: Podiatry

## 2021-08-20 ENCOUNTER — Encounter: Payer: Self-pay | Admitting: Podiatry

## 2021-08-20 ENCOUNTER — Other Ambulatory Visit: Payer: Self-pay

## 2021-08-20 DIAGNOSIS — M792 Neuralgia and neuritis, unspecified: Secondary | ICD-10-CM

## 2021-08-20 MED ORDER — GABAPENTIN 100 MG PO CAPS: 100.0000 mg | ORAL_CAPSULE | Freq: Three times a day (TID) | ORAL | 3 refills | Status: DC

## 2021-08-25 NOTE — Progress Notes (Signed)
?Subjective:  ?Patient ID: Christopher Schultz, male    DOB: 01-15-1971,  MRN: [id number] ? ?Chief Complaint  ?Patient presents with  ? Foot Pain  ? Follow-up  ?  Pt reports there is no improvement- he did not get the gabapentin   ? ? ?51 y.o. male presents with the above complaint.  Patient presents for follow-up for left leg numbness tingling.  He is a diabetic with A1c of 10.4%.  He states that he did not get gabapentin.  He would like to be represcribed.  He states the pain is about the same as it got any better. ? ? ?Review of Systems: Negative except as noted in the HPI. Denies N/V/F/Ch. ? ?Past Medical History:  ?Diagnosis Date  ? Hernia, abdominal   ? Hypertension 2019  ? Morbid obesity with BMI of 40.0-44.9, adult (HCC)   ? Morbid obesity with BMI of 40.0-44.9, adult ([facility]CC)   ? OSA (obstructive sleep apnea) 2004  ? OSA on CPAP   ? Pre-diabetes   ? Prediabetes 05/30/2017  ? ? ?Current Outpatient Medications:  ?  gabapentin (NEURONTIN) 100 MG capsule, Take 1 capsule (100 mg total) by mouth 3 (three) times daily., Disp: 90 capsule, Rfl: 3 ?  amLODipine (NORVASC) 10 MG tablet, Take 1 tablet (10 mg total) by mouth daily., Disp: 30 tablet, Rfl: 11 ?  atorvastatin (LIPITOR) 40 MG tablet, 1 tab by mouth daily in morning, Disp: 30 tablet, Rfl: 11 ?  carvedilol (COREG) 3.125 MG tablet, Take 1 tablet (3.125 mg total) by mouth 2 (two) times daily with a meal., Disp: 60 tablet, Rfl: 11 ?  cloNIDine (CATAPRES) 0.1 MG tablet, Take 1 tablet (0.1 mg total) by mouth 2 (two) times daily., Disp: 60 tablet, Rfl: 11 ?  Diclofenac Sodium 1.5 % SOLN, Apply 2 g topically 4 (four) times daily as needed. (Patient not taking: Reported on 06/28/2021), Disp: 300 mL, Rfl: 2 ?  glipiZIDE (GLUCOTROL XL) 10 MG 24 hr tablet, Take 1 tablet (10 mg total) by mouth daily with breakfast., Disp: 30 tablet, Rfl: 11 ?  lidocaine (LIDODERM) 5 %, Place 1 patch onto the skin daily. Remove & Discard patch within 12 hours or as directed by MD, Disp: 30 patch,  Rfl: 0 ?  losartan (COZAAR) 100 MG tablet, TAKE 1 TABLET BY MOUTH ONCE DAILY(STOP LISINOPRIL), Disp: 30 tablet, Rfl: 11 ?  meloxicam (MOBIC) 7.5 MG tablet, Take 1 tablet (7.5 mg total) by mouth daily. (Patient taking differently: Take 7.5 mg by mouth as needed.), Disp: 30 tablet, Rfl: 0 ?  metFORMIN (GLUCOPHAGE XR) 750 MG 24 hr tablet, 2 tabs by mouth with breakfast daily., Disp: 60 tablet, Rfl: 11 ? ?Social History  ? ?Tobacco Use  ?Smoking Status Never  ?Smokeless Tobacco Never  ? ? ?No Known Allergies ?Objective:  ?There were no vitals filed for this visit. ?There is no height or weight on file to calculate BMI. ?Constitutional Well developed. ?Well nourished.  ?Vascular Dorsalis pedis pulses palpable bilaterally. ?Posterior tibial pulses palpable bilaterally. ?Capillary refill normal to all digits.  ?No cyanosis or clubbing noted. ?Pedal hair growth normal.  ?Neurologic Normal speech. ?Oriented to person, place, and time. ?Epicritic sensation to light touch grossly present bilaterally.  ?Dermatologic Nails well groomed and normal in appearance. ?No open wounds. ?No skin lesions.  ?Orthopedic: Positive Tinel's sign with sural nerve compression.  This may likely be attributed by scar tissue formation.  Good range of motion noted at the ankle joint.  No equinus present.  No pain at the plantar fascia site.  No calf pain.  No concern for DVT  ? ?Radiographs: None ?Assessment:  ? ?No diagnosis found. ? ?Plan:  ?Patient was evaluated and treated and all questions answered. ? ?Left leg neuritis/sural nerve compression ?-All questions and concerns were discussed with the patient in extensive detail. ?-His pain is very sporadic however it could likely be attributed to being on his feet for long period of time.  At this time I discussed with the patient that he could benefit from gabapentin.  I discussed in extensive detail the importance of stretching and taking it easy.  He is also going to work on changing his  occupation to take the stress off of the foot. ?-He can continue regular duty if needed. ?-Gabapentin was represcribed. ? ?No follow-ups on file.  ?

## 2021-09-23 ENCOUNTER — Other Ambulatory Visit: Payer: Self-pay

## 2021-09-24 ENCOUNTER — Other Ambulatory Visit: Payer: Self-pay

## 2021-09-24 VITALS — BP 140/106 | HR 92

## 2021-09-24 DIAGNOSIS — Z0130 Encounter for examination of blood pressure without abnormal findings: Secondary | ICD-10-CM

## 2021-09-24 DIAGNOSIS — E782 Mixed hyperlipidemia: Secondary | ICD-10-CM

## 2021-09-24 DIAGNOSIS — E1165 Type 2 diabetes mellitus with hyperglycemia: Secondary | ICD-10-CM

## 2021-09-24 NOTE — Progress Notes (Signed)
Increase Carvedilol to 6.25 mg or 2 tabs twice daily. ?Repeat BP and pulse in 2 weeks. ?

## 2021-09-24 NOTE — Progress Notes (Signed)
Patient reported that he is taking all bp medication consistently. Patient took bp medication this morning. ?

## 2021-09-25 LAB — HEMOGLOBIN A1C
Est. average glucose Bld gHb Est-mCnc: 237 mg/dL
Hgb A1c MFr Bld: 9.9 % — ABNORMAL HIGH (ref 4.8–5.6)

## 2021-09-25 LAB — LIPID PANEL W/O CHOL/HDL RATIO
Cholesterol, Total: 144 mg/dL (ref 100–199)
HDL: 32 mg/dL — ABNORMAL LOW (ref >39)
LDL Chol Calc (NIH): 95 mg/dL (ref 0–99)
Triglycerides: 87 mg/dL (ref 0–149)
VLDL Cholesterol Cal: 17 mg/dL (ref 5–40)

## 2021-09-27 ENCOUNTER — Encounter: Payer: Self-pay | Admitting: Internal Medicine

## 2021-09-27 ENCOUNTER — Ambulatory Visit: Payer: Self-pay | Admitting: Internal Medicine

## 2021-09-27 VITALS — BP 140/104 | HR 84 | Resp 16 | Ht 70.0 in | Wt 324.0 lb

## 2021-09-27 DIAGNOSIS — I10 Essential (primary) hypertension: Secondary | ICD-10-CM

## 2021-09-27 DIAGNOSIS — E782 Mixed hyperlipidemia: Secondary | ICD-10-CM

## 2021-09-27 DIAGNOSIS — G4733 Obstructive sleep apnea (adult) (pediatric): Secondary | ICD-10-CM

## 2021-09-27 DIAGNOSIS — Z6841 Body Mass Index (BMI) 40.0 and over, adult: Secondary | ICD-10-CM

## 2021-09-27 DIAGNOSIS — E1165 Type 2 diabetes mellitus with hyperglycemia: Secondary | ICD-10-CM

## 2021-09-27 MED ORDER — ATORVASTATIN CALCIUM 80 MG PO TABS: ORAL_TABLET | ORAL | 11 refills | Status: DC

## 2021-09-27 MED ORDER — METFORMIN HCL ER 500 MG PO TB24: ORAL_TABLET | ORAL | 11 refills | Status: DC

## 2021-09-27 MED ORDER — CARVEDILOL 6.25 MG PO TABS: ORAL_TABLET | ORAL | 11 refills | Status: DC

## 2021-09-27 NOTE — Patient Instructions (Signed)
Gabapentin: Start taking Gabapentin 100 mg cap 1 cap at bedtime. In 3 days, increase to 2 caps at bedtime. In another 3 days, increase to 3 caps at bedtime You should be taking 3 caps at bedtime at this point.  In 3 days, start another 1 cap in the morning In 3 more days, increase to 2 caps in the morning In 3 more days, increase to 3 caps in the morning:  You should be taking 3 caps in the morning and 3 caps at bedtime at this point.  In 3 days, start another dose midday--1 cap In 3 more days, increase to 2 caps midday In 3 more days, increase to 3 caps midday. You should be taking 3 caps 3 times daily at this point.  Stay on this dose until follow up If you do not tolerate increasing the dose at any point, hold on the dose you tolerate or call clinic if having problems 

## 2021-09-27 NOTE — Progress Notes (Signed)
? ? ?Subjective:  ?  ?Patient ID: Christopher Schultz, male   DOB: 1970-06-22, 51 y.o.   MRN: [id number] ? ? ?HPI ? ? DM:  A1C mildly improved to 9.9%.  Not physically active due to problems with his left plantar fasciitis and surgery done in 03/2020.  Apparently has scar tissue where performed gastroc recess pressing on a nerve and plans for repeat surgery per patient.  He was started on Gabapentin and last week increased to 100 mg twice daily by Dr. Allena Katz, podiatry. Discussed can increase by 100 mg every 3 days and hole at 300 mg 3 times daily.  Plans to take a job with [employer] where he will not be on cement floors all day.   ? ?2.  Hypertension:  Has not yet increased Carvedilol to 6.25 mg twice daily based off his bp 3 days ago.  He does carry about 10  lbs more.  Was lost to follow up for a time and when returned, BP high.   ? ?3.  OSA:  still has not heard from [facility] about CPAP set up.  Not clear why he did not notify us previously.   Previously, played phone tag when tried to set up in 2021.   ? ?4.  Obesity:  He does know how to swim and [place]  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   ? ?5.  Hyperlipidemia:  All parts improved markedly back on Atorvastatin, but still not at goal.   ? ?6.  Cardiopulmonary:  no chest pain.  Feels a bit dyspneic at times with weight gain.   ? ?Current Meds  ?Medication Sig  ? amLODipine (NORVASC) 10 MG tablet Take 1 tablet (10 mg total) by mouth daily.  ? atorvastatin (LIPITOR) 40 MG tablet 1 tab by mouth daily in morning  ? carvedilol (COREG) 3.125 MG tablet Take 1 tablet (3.125 mg total) by mouth 2 (two) times daily with a meal.  ? cloNIDine (CATAPRES) 0.1 MG tablet Take 1 tablet (0.1 mg total) by mouth 2 (two) times daily.  ? gabapentin (NEURONTIN) 100 MG capsule Take 1 capsule (100 mg total) by mouth 3 (three) times daily.  ? glipiZIDE (GLUCOTROL XL) 10 MG 24 hr tablet Take 1 tablet (10 mg total) by mouth  daily with breakfast.  ? lidocaine (LIDODERM) 5 % Place 1 patch onto the skin daily. Remove & Discard patch within 12 hours or as directed by MD  ? losartan (COZAAR) 100 MG tablet TAKE 1 TABLET BY MOUTH ONCE DAILY(STOP LISINOPRIL)  ? meloxicam (MOBIC) 7.5 MG tablet Take 1 tablet (7.5 mg total) by mouth daily. (Patient taking differently: Take 7.5 mg by mouth as needed.)  ? metFORMIN (GLUCOPHAGE XR) 750 MG 24 hr tablet 2 tabs by mouth with breakfast daily.  ?Actually taking Metformin ER 750 mg twice daily with meals--does not miss.   ? ?No Known Allergies ? ? ?Review of Systems ? ? ? ?Objective:  ? ?BP (!) 140/104 (BP Location: Right Arm, Patient Position: Sitting, Cuff Size: Normal)   Pulse 84   Resp 16   Ht 5\' 10"  (1.778 m)   Wt (!) 324 lb (147 kg)   BMI 46.49 kg/m?  ? ?Physical Exam ?NAD ?Lungs:  CTA ?CV:  RRR without murmur or rub.  Radial and DP pulses normal and equal ?Abd:  S, + BS ?LE:  No edema. ? ? ?Assessment & Plan  ? ?  DM:  mild improvement.  Was well controlled when followed closely in 2021.  Making goals to get into water and to utilize bike/stationary bike to get off his feet and get active again.  Working on diet.  He is not checking sugars nor looking at nutrition labels.  Went over doing those things again.  Increase Metformin ER to 1000 mg twice daily.  Januvia would be next addition, but until has insurance, would need to apply for MAP at [facility]. ? ?2.  Hypertension:  chronic left foot/leg pain, stress, increased weight with decreased physical activity and decreased med compliance for a time all elements.  As above.  Increase Carvedilol to 6.25 mg twice daily.  BP and pulse check in 1 week.   ? ?3.  Hyperlipidemia:  not quite at goal.  Increase Atorvastatin to 80 mg daily.  FLP/hepatic prof in 6 weeks. ? ?4.  OSA:  checking into why has not heard from [facility].  To call in future if not hearing from referral.   ? ?5.  Neuropathic pain of left lower leg and foot:  encouraged him to titrate  Gabapentin by 100 mg every 3 days until taking the amount that controls pain well or until at 300 mg 3 times daily. ? ?

## 2021-10-01 ENCOUNTER — Encounter: Payer: Self-pay | Admitting: Podiatry

## 2021-10-01 ENCOUNTER — Ambulatory Visit (INDEPENDENT_AMBULATORY_CARE_PROVIDER_SITE_OTHER): Payer: Self-pay | Admitting: Podiatry

## 2021-10-01 DIAGNOSIS — M62838 Other muscle spasm: Secondary | ICD-10-CM

## 2021-10-01 MED ORDER — CYCLOBENZAPRINE HCL 10 MG PO TABS: 10.0000 mg | ORAL_TABLET | Freq: Three times a day (TID) | ORAL | 0 refills | Status: AC | PRN

## 2021-10-01 NOTE — Progress Notes (Signed)
?Subjective:  ?Patient ID: Christopher Schultz, male    DOB: 1971/01/03,  MRN: [id number] ? ?Chief Complaint  ?Patient presents with  ? neuritis   ? ? ?51 y.o. male presents with the above complaint. Patient presents with complaint of muscle spasm to the left leg.  Patient states he does not have any pain but it causes him to have a muscle spasm.  He states that he has been noticing it more more he wanted to get it evaluated he notices mostly when he is stretching in the morning.  He denies any other acute complaints.  The gabapentin has been helping him with nerve pain ? ? ?Review of Systems: Negative except as noted in the HPI. Denies N/V/F/Ch. ? ?Past Medical History:  ?Diagnosis Date  ? Hernia, abdominal   ? Hypertension 2019  ? Morbid obesity with BMI of 40.0-44.9, adult (HCC)   ? Morbid obesity with BMI of 40.0-44.9, adult (HCC)   ? OSA (obstructive sleep apnea) 2004  ? OSA on CPAP   ? Pre-diabetes   ? Prediabetes 05/30/2017  ? ? ?Current Outpatient Medications:  ?  cyclobenzaprine (FLEXERIL) 10 MG tablet, Take 1 tablet (10 mg total) by mouth 3 (three) times daily as needed for muscle spasms., Disp: 30 tablet, Rfl: 0 ?  amLODipine (NORVASC) 10 MG tablet, Take 1 tablet (10 mg total) by mouth daily., Disp: 30 tablet, Rfl: 11 ?  atorvastatin (LIPITOR) 80 MG tablet, 1 tab by mouth daily in morning, Disp: 30 tablet, Rfl: 11 ?  carvedilol (COREG) 6.25 MG tablet, 1 tab by mouth twice daily, Disp: 60 tablet, Rfl: 11 ?  cloNIDine (CATAPRES) 0.1 MG tablet, Take 1 tablet (0.1 mg total) by mouth 2 (two) times daily., Disp: 60 tablet, Rfl: 11 ?  gabapentin (NEURONTIN) 100 MG capsule, Take 1 capsule (100 mg total) by mouth 3 (three) times daily., Disp: 90 capsule, Rfl: 3 ?  glipiZIDE (GLUCOTROL XL) 10 MG 24 hr tablet, Take 1 tablet (10 mg total) by mouth daily with breakfast., Disp: 30 tablet, Rfl: 11 ?  lidocaine (LIDODERM) 5 %, Place 1 patch onto the skin daily. Remove & Discard patch within 12 hours or as directed by MD, Disp:  30 patch, Rfl: 0 ?  losartan (COZAAR) 100 MG tablet, TAKE 1 TABLET BY MOUTH ONCE DAILY(STOP LISINOPRIL), Disp: 30 tablet, Rfl: 11 ?  meloxicam (MOBIC) 7.5 MG tablet, Take 1 tablet (7.5 mg total) by mouth daily. (Patient taking differently: Take 7.5 mg by mouth as needed.), Disp: 30 tablet, Rfl: 0 ?  metFORMIN (GLUCOPHAGE-XR) 500 MG 24 hr tablet, 2 tabs by mouth twice daily., Disp: 120 tablet, Rfl: 11 ? ?Social History  ? ?Tobacco Use  ?Smoking Status Never  ?Smokeless Tobacco Never  ? ? ?No Known Allergies ?Objective:  ?There were no vitals filed for this visit. ?There is no height or weight on file to calculate BMI. ?Constitutional Well developed. ?Well nourished.  ?Vascular Dorsalis pedis pulses palpable bilaterally. ?Posterior tibial pulses palpable bilaterally. ?Capillary refill normal to all digits.  ?No cyanosis or clubbing noted. ?Pedal hair growth normal.  ?Neurologic Normal speech. ?Oriented to person, place, and time. ?Epicritic sensation to light touch grossly present bilaterally.  ?Dermatologic Nails well groomed and normal in appearance. ?No open wounds. ?No skin lesions.  ?Orthopedic: Positive Tinel's sign with sural nerve compression.  This may likely be attributed by scar tissue formation.  Good range of motion noted at the ankle joint.  No equinus present.  No pain at the  plantar fascia site.  No calf pain.  No concern for DVT ? ?Unable to recreate the muscle spasm today.  ? ?Radiographs: None ?Assessment:  ? ?1. Muscle spasm   ? ? ?Plan:  ?Patient was evaluated and treated and all questions answered. ? ?Left muscle spasm/sural nerve compression ?-All questions and concerns were discussed with the patient in extensive detail. ?-His pain is very sporadic and the muscle spasm seems to happen very randomly as well.  He will continue to keep an eye out on it.  And see if the muscle relaxant helps. ?-He can continue regular duty if needed. ?-Flexeril was dispensed for muscle spasm ?-Gabapentin is helping  him a little bit continue taking gabapentin ? ?No follow-ups on file.  ?

## 2021-10-04 ENCOUNTER — Ambulatory Visit: Payer: Self-pay

## 2021-10-04 VITALS — BP 140/100 | HR 80

## 2021-10-04 DIAGNOSIS — Z0130 Encounter for examination of blood pressure without abnormal findings: Secondary | ICD-10-CM

## 2021-10-04 NOTE — Progress Notes (Signed)
Patient reported that he is taking bp medication consistently. Patient has yet to receive his CPAP machine, but has confirmed that [facility] is currently processing his order. Patient is concerned that lack of CPAP machine could be affecting his bp.  ?

## 2021-10-05 NOTE — Progress Notes (Signed)
Pulse still with significant room to increase Beta blockade. ?Increase Carvedilol to 12.5 mg twice daily (2 tabs twice daily) ?Repeat bp and pulse in 1 week. ?

## 2021-10-18 ENCOUNTER — Ambulatory Visit: Payer: Self-pay

## 2021-10-18 VITALS — BP 138/92 | HR 85

## 2021-10-18 DIAGNOSIS — Z0130 Encounter for examination of blood pressure without abnormal findings: Secondary | ICD-10-CM

## 2021-10-18 NOTE — Progress Notes (Unsigned)
Patient reported that he has been taking bp medication consistently. Patient has been taking carvedilol 2 tabs twice daily.  4 tabs of Carvedilol twice daily until out, will send in new Rx  After reporting bp to Dr Delrae Alfred, patient will start Carvedilol 25 mg twice daily. Repeat bp in a couple weeks.

## 2021-10-20 MED ORDER — CARVEDILOL 25 MG PO TABS: 25.0000 mg | ORAL_TABLET | Freq: Two times a day (BID) | ORAL | 3 refills | Status: DC

## 2021-11-08 ENCOUNTER — Other Ambulatory Visit: Payer: Self-pay

## 2021-11-15 ENCOUNTER — Other Ambulatory Visit: Payer: Self-pay

## 2021-11-17 ENCOUNTER — Ambulatory Visit: Payer: Self-pay | Admitting: Podiatry

## 2021-11-22 ENCOUNTER — Other Ambulatory Visit: Payer: Self-pay

## 2021-12-02 ENCOUNTER — Ambulatory Visit: Payer: Self-pay | Admitting: Podiatry

## 2022-01-03 ENCOUNTER — Other Ambulatory Visit: Payer: Self-pay

## 2022-01-05 ENCOUNTER — Ambulatory Visit: Payer: Self-pay | Admitting: Podiatry

## 2022-01-10 ENCOUNTER — Ambulatory Visit: Payer: Self-pay | Admitting: Internal Medicine

## 2022-02-02 ENCOUNTER — Ambulatory Visit: Payer: Self-pay | Admitting: Podiatry

## 2022-02-25 ENCOUNTER — Ambulatory Visit: Payer: Self-pay | Admitting: Podiatry

## 2022-03-23 ENCOUNTER — Ambulatory Visit: Payer: Self-pay | Admitting: Podiatry

## 2022-04-07 ENCOUNTER — Ambulatory Visit: Payer: Self-pay | Admitting: Podiatry

## 2022-05-05 ENCOUNTER — Other Ambulatory Visit: Payer: Self-pay | Admitting: Podiatry

## 2022-05-06 ENCOUNTER — Ambulatory Visit: Payer: Self-pay | Admitting: Podiatry

## 2022-06-07 ENCOUNTER — Other Ambulatory Visit: Payer: Self-pay | Admitting: Internal Medicine

## 2022-07-05 ENCOUNTER — Other Ambulatory Visit: Payer: Self-pay | Admitting: Internal Medicine

## 2022-07-08 ENCOUNTER — Other Ambulatory Visit: Payer: Self-pay | Admitting: Internal Medicine

## 2022-07-25 ENCOUNTER — Emergency Department (HOSPITAL_COMMUNITY)
Admission: EM | Admit: 2022-07-25 | Discharge: 2022-07-26 | Disposition: A | Discharge status: Home / Self Care | Payer: Medicaid Other | Attending: Emergency Medicine | Admitting: Emergency Medicine

## 2022-07-25 ENCOUNTER — Encounter (HOSPITAL_COMMUNITY): Payer: Self-pay | Admitting: Emergency Medicine

## 2022-07-25 ENCOUNTER — Other Ambulatory Visit: Payer: Self-pay

## 2022-07-25 DIAGNOSIS — Z79899 Other long term (current) drug therapy: Secondary | ICD-10-CM | POA: Diagnosis not present

## 2022-07-25 DIAGNOSIS — I10 Essential (primary) hypertension: Secondary | ICD-10-CM | POA: Insufficient documentation

## 2022-07-25 DIAGNOSIS — R0789 Other chest pain: Secondary | ICD-10-CM

## 2022-07-25 LAB — CBC WITH DIFFERENTIAL/PLATELET
Abs Immature Granulocytes: 0 10*3/uL (ref 0.00–0.07)
Basophils Absolute: 0 10*3/uL (ref 0.0–0.1)
Basophils Relative: 1 %
Eosinophils Absolute: 0.1 10*3/uL (ref 0.0–0.5)
Eosinophils Relative: 1 %
HCT: 44.9 % (ref 39.0–52.0)
Hemoglobin: 14.5 g/dL (ref 13.0–17.0)
Immature Granulocytes: 0 %
Lymphocytes Relative: 27 %
Lymphs Abs: 1.7 10*3/uL (ref 0.7–4.0)
MCH: 29.4 pg (ref 26.0–34.0)
MCHC: 32.3 g/dL (ref 30.0–36.0)
MCV: 91.1 fL (ref 80.0–100.0)
Monocytes Absolute: 0.7 10*3/uL (ref 0.1–1.0)
Monocytes Relative: 11 %
Neutro Abs: 4 10*3/uL (ref 1.7–7.7)
Neutrophils Relative %: 60 %
Platelets: 295 10*3/uL (ref 150–400)
RBC: 4.93 MIL/uL (ref 4.22–5.81)
RDW: 13.8 % (ref 11.5–15.5)
WBC: 6.5 10*3/uL (ref 4.0–10.5)
nRBC: 0 % (ref 0.0–0.2)

## 2022-07-25 LAB — BASIC METABOLIC PANEL
Anion gap: 6 (ref 5–15)
BUN: 15 mg/dL (ref 6–20)
CO2: 23 mmol/L (ref 22–32)
Calcium: 9.4 mg/dL (ref 8.9–10.3)
Chloride: 113 mmol/L — ABNORMAL HIGH (ref 98–111)
Creatinine, Ser: 1.13 mg/dL (ref 0.61–1.24)
GFR, Estimated: >60 mL/min (ref >60)
Glucose, Bld: 81 mg/dL (ref 70–99)
Potassium: 4 mmol/L (ref 3.5–5.1)
Sodium: 142 mmol/L (ref 135–145)

## 2022-07-25 LAB — TROPONIN I (HIGH SENSITIVITY): Troponin I (High Sensitivity): 3 ng/L (ref <18)

## 2022-07-25 LAB — CBG MONITORING, ED: Glucose-Capillary: 91 mg/dL (ref 70–99)

## 2022-07-25 NOTE — ED Provider Triage Note (Signed)
Emergency Medicine Provider Triage Evaluation Note  Christopher Schultz , a 52 y.o. male  was evaluated in triage.  Pt complains of sub sternal chest pain and pressure with inspiration that began last night.  Patient had chest x-ray and EKG done but was sent here for blood work.  He states the pain feels like "cramping" and radiates to his mid back.  He reports the pain is only when he breathes currently.  Denies shortness of breath, LOC, nausea, vomiting, cough.  He does smoke marijuana daily.    Review of Systems  Positive: As above Negative: As above  Physical Exam  BP (!) 161/106   Pulse 79   Resp 18   Ht 6' (1.829 m)   Wt 136.1 kg   SpO2 100%   BMI 40.69 kg/m  Gen:   Awake, no distress   Resp:  Normal effort  MSK:   Moves extremities without difficulty  Other:    Medical Decision Making  Medically screening exam initiated at 5:04 PM.  Appropriate orders placed.  Christopher Schultz was informed that the remainder of the evaluation will be completed by another provider, this initial triage assessment does not replace that evaluation, and the importance of remaining in the ED until their evaluation is complete.     Theressa Stamps R, [place] 07/25/22 [phone]

## 2022-07-25 NOTE — ED Triage Notes (Signed)
Patient arrives ambulatory by POV sent from [facility] for further evaluation of mid sternal chest pain and pressure with inspiration that started last night. Patient had chest x-ray and EKG but sent here for blood work.

## 2022-07-26 LAB — TROPONIN I (HIGH SENSITIVITY): Troponin I (High Sensitivity): 3 ng/L (ref <18)

## 2022-07-26 LAB — D-DIMER, QUANTITATIVE: D-Dimer, Quant: 0.45 ug/mL-FEU (ref 0.00–0.50)

## 2022-07-26 MED ORDER — IBUPROFEN 800 MG PO TABS
800.0000 mg | ORAL_TABLET | Freq: Once | ORAL | Status: AC
  Administered 2022-07-26: 800 mg via ORAL
  Filled 2022-07-26: qty 1

## 2022-07-26 NOTE — Discharge Instructions (Signed)
You were seen today for chest pain.  Your workup today is reassuring.  Given the reproducible nature of the pain, it is likely musculoskeletal.  Take ibuprofen as needed for pain.  Your heart testing is reassuring today.  However you will be referred to cardiology for formal evaluation.

## 2022-07-26 NOTE — ED Provider Notes (Signed)
[place] EMERGENCY DEPARTMENT AT [facility] Provider Note   CSN: [id number] Arrival date & time: 07/25/22  1645     History  Chief Complaint  Patient presents with   Chest Pain    CLESSON LAWVER is a 52 y.o. male.  HPI     This is a 52 year old male who presents with concerns for chest pain.  Was seen and evaluated at urgent care earlier today.  He describes onset of anterior chest pain that is worse with coughing and breathing.  Denies any recent fevers or upper respiratory symptoms.  Has never had pain like this before.  It is not exertional in nature.  No recent history of travel or lower extremity swelling.  No history of blood clots.  It is fairly constant nonradiating.  Home Medications Prior to Admission medications   Medication Sig Start Date End Date Taking? Authorizing Provider  TRULICITY [id number] SOPN Inject 0.75 mg into the skin once a week. 06/05/22  Yes [provider]  amLODipine (NORVASC) 10 MG tablet TAKE 1 TABLET BY MOUTH ONCE DAILY 06/09/22   Mack Hook, MD  atorvastatin (LIPITOR) 80 MG tablet 1 tab by mouth daily in morning 09/27/21   Mack Hook, MD  carvedilol (COREG) 25 MG tablet Take 1 tablet (25 mg total) by mouth 2 (two) times daily with a meal. 10/20/21   Mack Hook, MD  cloNIDine (CATAPRES) 0.1 MG tablet TAKE ONE (1) TABLET BY MOUTH TWICE DAILY 07/10/22   Mack Hook, MD  cyclobenzaprine (FLEXERIL) 10 MG tablet Take 1 tablet (10 mg total) by mouth 3 (three) times daily as needed for muscle spasms. 10/01/21   Felipa Furnace, DPM  gabapentin (NEURONTIN) 100 MG capsule TAKE 1 CAPSULE BY MOUTH 3 TIMES DAILY 05/06/22   Felipa Furnace, DPM  glipiZIDE (GLUCOTROL XL) 10 MG 24 hr tablet TAKE 1 TABLET BY MOUTH ONCE DAILY WITH BREAKFAST 06/09/22   Mack Hook, MD  lidocaine (LIDODERM) 5 % Place 1 patch onto the skin daily. Remove & Discard patch within 12 hours or as directed by MD 05/07/21   Henderly, Britni  A, PA-C  losartan (COZAAR) 100 MG tablet TAKE 1 TABLET BY MOUTH ONCE DAILY *DISCONTINUE LISINOPRIL* 06/09/22   Mack Hook, MD  meloxicam (MOBIC) 7.5 MG tablet Take 1 tablet (7.5 mg total) by mouth daily. Patient taking differently: Take 7.5 mg by mouth as needed. 05/07/21   Henderly, Britni A, PA-C  metFORMIN (GLUCOPHAGE-XR) 500 MG 24 hr tablet 2 tabs by mouth twice daily. 09/27/21   Mack Hook, MD      Allergies    Patient has no known allergies.    Review of Systems   Review of Systems  Constitutional:  Negative for fever.  Respiratory:  Negative for shortness of breath.   Cardiovascular:  Positive for chest pain. Negative for leg swelling.  All other systems reviewed and are negative.   Physical Exam Updated Vital Signs BP (!) 158/111   Pulse 67   Temp 97.7 F (36.5 C)   Resp 15   Ht 1.829 m (6')   Wt 136.1 kg   SpO2 100%   BMI 40.69 kg/m  Physical Exam Vitals and nursing note reviewed.  Constitutional:      Appearance: He is well-developed. He is obese.  HENT:     Head: Normocephalic and atraumatic.  Eyes:     Pupils: Pupils are equal, round, and reactive to light.  Cardiovascular:     Rate and Rhythm:  Normal rate and regular rhythm.     Heart sounds: Normal heart sounds. No murmur heard. Pulmonary:     Effort: Pulmonary effort is normal. No respiratory distress.     Breath sounds: Normal breath sounds. No wheezing.  Chest:     Chest wall: Tenderness present.     Comments: Anterior chest wall tenderness to palpation, no overlying skin changes Abdominal:     General: Bowel sounds are normal.     Palpations: Abdomen is soft.     Tenderness: There is no abdominal tenderness. There is no rebound.  Musculoskeletal:     Cervical back: Neck supple.  Lymphadenopathy:     Cervical: No cervical adenopathy.  Skin:    General: Skin is warm and dry.  Neurological:     Mental Status: He is alert and oriented to person, place, and time.  Psychiatric:         Mood and Affect: Mood normal.     ED Results / Procedures / Treatments   Labs (all labs ordered are listed, but only abnormal results are displayed) Labs Reviewed  BASIC METABOLIC PANEL - Abnormal; Notable for the following components:      Result Value   Chloride 113 (*)    All other components within normal limits  CBC WITH DIFFERENTIAL/PLATELET  D-DIMER, QUANTITATIVE  CBG MONITORING, ED  TROPONIN I (HIGH SENSITIVITY)  TROPONIN I (HIGH SENSITIVITY)    EKG EKG Interpretation  Date/Time:  Monday July 25 2022 16:59:31 EST Ventricular Rate:  76 PR Interval:  155 QRS Duration: 99 QT Interval:  372 QTC Calculation: 419 R Axis:   0 Text Interpretation: Sinus rhythm Confirmed by Thayer Jew [phone]) on 07/25/2022 11:59:45 PM  Radiology No results found.  Procedures Procedures    Medications Ordered in ED Medications  ibuprofen (ADVIL) tablet 800 mg (800 mg Oral Given 07/26/22 0027)    ED Course/ Medical Decision Making/ A&P                             Medical Decision Making Amount and/or Complexity of Data Reviewed Labs: ordered.  Risk Prescription drug management.   This patient presents to the ED for concern of chest pain, this involves an extensive number of treatment options, and is a complaint that carries with it a high risk of complications and morbidity.  I considered the following differential and admission for this acute, potentially life threatening condition.  The differential diagnosis includes ACS, PE, pneumothorax, pneumonia, viral infection, musculoskeletal  MDM:    This is a 52 year old male with multiple risk factors for ACS who presents with chest pain.  It is fairly atypical for ACS and it is reproducible on exam although he does have significant risk factors.  EKG is normal.  Troponin x 2 negative.  Feel this is very reassuring.  I did for stratify him for PE with D-dimer and this is negative.  He has no risk factors and is low  risk.  Otherwise lab work is reassuring.  Patient was given ibuprofen.  Will treat for musculoskeletal etiology and refer to cardiology for formal ischemic evaluation given risk factors although today I feel his symptoms are not related to primary ACS.  (Labs, imaging, consults)  Labs: I Ordered, and personally interpreted labs.  The pertinent results include: CBC, BMP, troponin  Imaging Studies ordered: I ordered imaging studies including chest x-ray I independently visualized and interpreted imaging. I agree with the radiologist interpretation  Additional history obtained from chart review.  External records from outside source obtained and reviewed including prior evaluations  Cardiac Monitoring: The patient was maintained on a cardiac monitor.  If on the cardiac monitor, I personally viewed and interpreted the cardiac monitored which showed an underlying rhythm of: Sinus rhythm  Reevaluation: After the interventions noted above, I reevaluated the patient and found that they have :improved  Social Determinants of Health:  lives independently  Disposition: Discharge  Co morbidities that complicate the patient evaluation  Past Medical History:  Diagnosis Date   Hernia, abdominal    Hypertension 2019   Morbid obesity with BMI of 40.0-44.9, adult ([place])    Morbid obesity with BMI of 40.0-44.9, adult ([place])    OSA (obstructive sleep apnea) 2004   OSA on CPAP    Pre-diabetes    Prediabetes 05/30/2017     Medicines Meds ordered this encounter  Medications   ibuprofen (ADVIL) tablet 800 mg    I have reviewed the patients home medicines and have made adjustments as needed  Problem List / ED Course: Problem List Items Addressed This Visit   None Visit Diagnoses     Atypical chest pain    -  Primary   Relevant Orders   Ambulatory referral to Cardiology                   Final Clinical Impression(s) / ED Diagnoses Final diagnoses:  Atypical chest pain     Rx / DC Orders ED Discharge Orders          Ordered    Ambulatory referral to Cardiology        07/26/22 0127              Merryl Hacker, MD 07/26/22 [phone]

## 2022-08-11 ENCOUNTER — Other Ambulatory Visit: Payer: Self-pay | Admitting: Internal Medicine

## 2022-08-12 ENCOUNTER — Other Ambulatory Visit: Payer: Self-pay | Admitting: Internal Medicine

## 2022-08-12 MED ORDER — CARVEDILOL 25 MG PO TABS: 25.0000 mg | ORAL_TABLET | Freq: Two times a day (BID) | ORAL | 2 refills | Status: AC

## 2022-08-17 NOTE — Progress Notes (Deleted)
CARDIOLOGY CONSULT NOTE       Patient ID: Christopher Schultz MRN: [id number] DOB/AGE: Feb 09, 1971 52 y.o.  Admit date: (Not on file) Referring Physician: [facility] ED Primary Physician: Mack Hook, MD Primary Cardiologist: New Reason for Consultation: Chest pain  Active Problems:   * No active hospital problems. *   HPI:  52 y.o. referred by [facility] ED Dr Dina Rich for chest pain. History of morbid obesity OSA pre diabetes and HTN Works as a Biomedical scientist at [employer]. Sedentary Seen in ED 07/25/22 after going to urgent care Anterior chest pain worse with coughing and inspiration Not exertional Pain constant and does not radiate BP elevated in ED 158/111 mmHg had pain to palpation on exam in ER over anterior chest   ECG normal Negative troponin x 2 D dimer negative CXR not done   ***  ROS All other systems reviewed and negative except as noted above  Past Medical History:  Diagnosis Date   Chest pain    Hernia, abdominal    Hypertension 2019   Morbid obesity with BMI of 40.0-44.9, adult ([place])    Morbid obesity with BMI of 40.0-44.9, adult ([facility])    OSA (obstructive sleep apnea) 2004   OSA on CPAP    Pre-diabetes    Prediabetes 05/30/2017    Family History  Problem Relation Age of Onset   Hypertension Mother    Fibromyalgia Mother    Diabetes Father    Hypertension Father    Peripheral Artery Disease Father    Hypertension Brother     Social History   Socioeconomic History   Marital status: Significant Other    Spouse name: Sherron Ales   Number of children: 2   Years of education: HS   Highest education level: Not on file  Occupational History   Occupation: sou chef    Comment: Engineer, 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  Tobacco Use   Smoking status: Never   Smokeless tobacco: Never  Vaping Use   Vaping Use: Never used  Substance and Sexual Activity   Alcohol use: Yes    Comment: ocassionally   Drug use: Yes    Types: Marijuana    Sexual activity: Yes    Birth control/protection: Other-see comments    Comment: fiance' birth control  Other Topics Concern   Not on file  Social History Narrative   Not on file   Social Determinants of Health   Financial Resource Strain: Low Risk  (09/01/2017)   Overall Financial Resource Strain (CARDIA)    Difficulty of Paying Living Expenses: Not hard at all  Food Insecurity: No Food Insecurity (09/01/2017)   Hunger Vital Sign    Worried About Running Out of Food in the Last Year: Never true    Ran Out of Food in the Last Year: Never true  Transportation Needs: No Transportation Needs (09/01/2017)   PRAPARE - Hydrologist (Medical): No    Lack of Transportation (Non-Medical): No  Physical Activity: Insufficiently Active (09/01/2017)   Exercise Vital Sign    Days of Exercise per Week: 3 days    Minutes of Exercise per Session: 20 min  Stress: No Stress Concern Present (09/01/2017)   [place]    Feeling of Stress : Only a little  Social Connections: Unknown (09/01/2017)   Social Connection and Isolation Panel [NHANES]    Frequency of Communication with Friends and Family:  Not on file    Frequency of Social Gatherings with Friends and Family: Twice a week    Attends Religious Services: Never    Marine scientist or Organizations: No    Attends Archivist Meetings: Never    Marital Status: Living with partner  Intimate Partner Violence: Unknown (09/01/2017)   Humiliation, Afraid, Rape, and Kick questionnaire    Fear of Current or Ex-Partner: Not on file    Emotionally Abused: No    Physically Abused: No    Sexually Abused: Not on file    Past Surgical History:  Procedure Laterality Date   ABDOMINAL HERNIA REPAIR  123456   umbilical herina repair for incarceration   EYE SURGERY     KNEE SURGERY     left   plantar fiscitis     UMBILICAL HERNIA REPAIR  08/11/2017    Incarcerated hernia   UMBILICAL HERNIA REPAIR N/A 08/11/2017   Procedure: HERNIA REPAIR UMBILICAL ADULT OPEN;  Surgeon: Kinsinger, Arta Bruce, MD;  Location: [facility];  Service: General;  Laterality: N/A;      Current Outpatient Medications:    amLODipine (NORVASC) 10 MG tablet, TAKE 1 TABLET BY MOUTH ONCE DAILY, Disp: 30 tablet, Rfl: 5   atorvastatin (LIPITOR) 80 MG tablet, 1 tab by mouth daily in morning, Disp: 30 tablet, Rfl: 11   carvedilol (COREG) 25 MG tablet, Take 1 tablet (25 mg total) by mouth 2 (two) times daily with a meal., Disp: 60 tablet, Rfl: 2   cloNIDine (CATAPRES) 0.1 MG tablet, TAKE ONE (1) TABLET BY MOUTH TWICE DAILY, Disp: 60 tablet, Rfl: 2   cyclobenzaprine (FLEXERIL) 10 MG tablet, Take 1 tablet (10 mg total) by mouth 3 (three) times daily as needed for muscle spasms., Disp: 30 tablet, Rfl: 0   gabapentin (NEURONTIN) 100 MG capsule, TAKE 1 CAPSULE BY MOUTH 3 TIMES DAILY, Disp: 90 capsule, Rfl: 10   glipiZIDE (GLUCOTROL XL) 10 MG 24 hr tablet, TAKE 1 TABLET BY MOUTH ONCE DAILY WITH BREAKFAST, Disp: 30 tablet, Rfl: 5   lidocaine (LIDODERM) 5 %, Place 1 patch onto the skin daily. Remove & Discard patch within 12 hours or as directed by MD, Disp: 30 patch, Rfl: 0   losartan (COZAAR) 100 MG tablet, TAKE 1 TABLET BY MOUTH ONCE DAILY *DISCONTINUE LISINOPRIL*, Disp: 30 tablet, Rfl: 5   meloxicam (MOBIC) 7.5 MG tablet, Take 1 tablet (7.5 mg total) by mouth daily. (Patient taking differently: Take 7.5 mg by mouth as needed.), Disp: 30 tablet, Rfl: 0   metFORMIN (GLUCOPHAGE-XR) 500 MG 24 hr tablet, 2 tabs by mouth twice daily., Disp: 120 tablet, Rfl: 11   TRULICITY [id number] SOPN, Inject 0.75 mg into the skin once a week., Disp: , Rfl:     Physical Exam: There were no vitals taken for this visit.    Affect appropriate Obese black male  HEENT: normal Neck supple with no adenopathy JVP normal no bruits no thyromegaly Lungs clear with no wheezing and good diaphragmatic  motion Heart:  S1/S2 no murmur, no rub, gallop or click PMI normal Abdomen: benighn, BS positve, no tenderness, no AAA no bruit.  No HSM or HJR Distal pulses intact with no bruits No edema Neuro non-focal Skin warm and dry No muscular weakness  Labs:   Lab Results  Component Value Date   WBC 6.5 07/25/2022   HGB 14.5 07/25/2022   HCT 44.9 07/25/2022   MCV 91.1 07/25/2022   PLT 295 07/25/2022   No results  for input(s): "NA", "K", "CL", "CO2", "BUN", "CREATININE", "CALCIUM", "PROT", "BILITOT", "ALKPHOS", "ALT", "AST", "GLUCOSE" in the last 168 hours.  Invalid input(s): "LABALBU" No results found for: "CKTOTAL", "CKMB", "CKMBINDEX", "TROPONINI"  Lab Results  Component Value Date   CHOL 144 09/24/2021   CHOL 200 (H) 06/28/2021   CHOL 127 02/04/2020   Lab Results  Component Value Date   HDL 32 (L) 09/24/2021   HDL 30 (L) 06/28/2021   HDL 36 (L) 02/04/2020   Lab Results  Component Value Date   LDLCALC 95 09/24/2021   LDLCALC 135 (H) 06/28/2021   LDLCALC 72 02/04/2020   Lab Results  Component Value Date   TRIG 87 09/24/2021   TRIG 191 (H) 06/28/2021   TRIG 104 02/04/2020   Lab Results  Component Value Date   CHOLHDL 5.7 08/12/2017   No results found for: "LDLDIRECT"    Radiology: No results found.  EKG: SR rate 76 normal 07/28/22   ASSESSMENT AND PLAN:   Chest pain: atypical likely musculoskeletal Given age, risk factors needs risk stratification Shared decision making *** HTN:  Discussed low sodium DASH diet and exercise Continue amlodipine, coreg, losartan and clonidine f/u primary HLD:  continue high dose lipitor  DM:  Discussed low carb diet.  Target hemoglobin A1c is 6.5 or less.  Continue current medications. He is poorly controlled with A1c 9.9   ***  F/U cardiology PRN pending test results   Signed: Jenkins Rouge 08/17/2022, 7:47 AM

## 2022-08-23 ENCOUNTER — Ambulatory Visit: Payer: Medicaid Other | Attending: Cardiovascular Disease | Admitting: Cardiovascular Disease

## 2022-09-07 ENCOUNTER — Other Ambulatory Visit: Payer: Self-pay | Admitting: Internal Medicine

## 2022-09-30 ENCOUNTER — Other Ambulatory Visit: Payer: Self-pay | Admitting: Internal Medicine

## 2022-10-03 ENCOUNTER — Telehealth: Payer: Self-pay | Admitting: Internal Medicine

## 2022-10-03 NOTE — Telephone Encounter (Signed)
Patient stated that he was going through some issues that prevented him from attending to his appointments, patient did not wanted to specify the reason.  Patient has been rescheduled.

## 2022-10-11 ENCOUNTER — Ambulatory Visit: Payer: Medicaid Other | Attending: Cardiovascular Disease | Admitting: Cardiology

## 2022-10-12 ENCOUNTER — Encounter: Payer: Self-pay | Admitting: Cardiology

## 2022-10-13 ENCOUNTER — Other Ambulatory Visit: Payer: Self-pay | Admitting: Internal Medicine

## 2022-11-03 ENCOUNTER — Other Ambulatory Visit: Payer: Self-pay | Admitting: Internal Medicine

## 2022-12-03 ENCOUNTER — Other Ambulatory Visit: Payer: Self-pay | Admitting: Internal Medicine

## 2022-12-07 ENCOUNTER — Other Ambulatory Visit: Payer: Self-pay | Admitting: Internal Medicine

## 2023-01-04 ENCOUNTER — Other Ambulatory Visit: Payer: Self-pay | Admitting: Internal Medicine

## 2023-01-06 ENCOUNTER — Other Ambulatory Visit: Payer: BLUE CROSS/BLUE SHIELD

## 2023-01-10 ENCOUNTER — Ambulatory Visit: Payer: BLUE CROSS/BLUE SHIELD | Admitting: Internal Medicine

## 2023-01-16 ENCOUNTER — Other Ambulatory Visit: Payer: Self-pay | Admitting: Internal Medicine

## 2023-01-16 NOTE — Telephone Encounter (Signed)
Would you please call Christopher Schultz and find out what is going on that he has not come in for over 1 year.

## 2023-01-18 NOTE — Telephone Encounter (Signed)
Called patient no answer and unable to lvm (mailbox not set up)

## 2023-01-23 ENCOUNTER — Other Ambulatory Visit: Payer: Self-pay | Admitting: Internal Medicine

## 2023-01-26 ENCOUNTER — Other Ambulatory Visit: Payer: Self-pay | Admitting: Internal Medicine

## 2023-01-27 ENCOUNTER — Other Ambulatory Visit: Payer: Self-pay | Admitting: Internal Medicine

## 2023-01-29 ENCOUNTER — Other Ambulatory Visit: Payer: Self-pay | Admitting: Internal Medicine

## 2023-01-31 ENCOUNTER — Other Ambulatory Visit: Payer: Self-pay | Admitting: Internal Medicine

## 2023-02-06 NOTE — Telephone Encounter (Signed)
Called patient but patient did not answer and was unable to leave a voicemail because vm is not set up.

## 2023-02-18 ENCOUNTER — Other Ambulatory Visit: Payer: Self-pay | Admitting: Internal Medicine

## 2023-03-17 NOTE — Telephone Encounter (Signed)
Called patient, patient did not answer and unable to lvm because phone would ring and at the end it would cut the call off.

## 2023-04-17 NOTE — Telephone Encounter (Signed)
Called patient, no answer and unable to lvm. 04/17/2023

## 2023-06-23 NOTE — Telephone Encounter (Signed)
Called patient, no answer and unable to leave voicemail 06/23/2023.

## 2023-06-28 ENCOUNTER — Encounter: Payer: Self-pay | Admitting: Podiatry

## 2023-06-28 ENCOUNTER — Ambulatory Visit (INDEPENDENT_AMBULATORY_CARE_PROVIDER_SITE_OTHER): Payer: BLUE CROSS/BLUE SHIELD

## 2023-06-28 ENCOUNTER — Ambulatory Visit (INDEPENDENT_AMBULATORY_CARE_PROVIDER_SITE_OTHER): Payer: BLUE CROSS/BLUE SHIELD | Admitting: Podiatry

## 2023-06-28 DIAGNOSIS — Z01818 Encounter for other preprocedural examination: Secondary | ICD-10-CM | POA: Diagnosis not present

## 2023-06-28 DIAGNOSIS — M7752 Other enthesopathy of left foot: Secondary | ICD-10-CM

## 2023-06-28 DIAGNOSIS — M722 Plantar fascial fibromatosis: Secondary | ICD-10-CM | POA: Diagnosis not present

## 2023-06-28 DIAGNOSIS — M778 Other enthesopathies, not elsewhere classified: Secondary | ICD-10-CM

## 2023-06-28 NOTE — Progress Notes (Signed)
Subjective:  Patient ID: Christopher Schultz, male    DOB: Dec 01, 1970,  MRN: [id number]  Chief Complaint  Patient presents with   Foot Pain    RM#11 Left foot pain started around 2-3 weeks ago standing and walking aggravates symptoms. No pain if he stays off the foot.    53 y.o. male presents with the above complaint.  Patient presents with follow-up of left plantar fasciitis.  He had relief for the last few years.  He started noticing it coming back.  He would like to discuss surgical options as he has already tried cam boot immobilization padding protecting at home on his own for last few weeks.  None of which has helped.  He does not want undergo steroid injection as it is painful.  He would like to discuss surgical options at this time.   Review of Systems: Negative except as noted in the HPI. Denies N/V/F/Ch.  Past Medical History:  Diagnosis Date   Chest pain    Hernia, abdominal    Hypertension 2019   Morbid obesity with BMI of 40.0-44.9, adult ([facility])    Morbid obesity with BMI of 40.0-44.9, adult ([facility])    OSA (obstructive sleep apnea) 2004   OSA on CPAP    Pre-diabetes    Prediabetes 05/30/2017    Current Outpatient Medications:    amLODipine (NORVASC) 10 MG tablet, TAKE 1 TABLET BY MOUTH ONCE DAILY *EMERGENCY REFILL*, Disp: 30 tablet, Rfl: 0   atorvastatin (LIPITOR) 80 MG tablet, TAKE 1 TABLET BY MOUTH ONCE DAILY IN THE MORNING, Disp: 30 tablet, Rfl: 3   carvedilol (COREG) 25 MG tablet, Take 1 tablet (25 mg total) by mouth 2 (two) times daily with a meal., Disp: 60 tablet, Rfl: 2   cloNIDine (CATAPRES) 0.1 MG tablet, TAKE ONE (1) TABLET BY MOUTH TWICE DAILY *PATIENT NEEDS APPOINTMENT*, Disp: 60 tablet, Rfl: 2   cyclobenzaprine (FLEXERIL) 10 MG tablet, Take 1 tablet (10 mg total) by mouth 3 (three) times daily as needed for muscle spasms., Disp: 30 tablet, Rfl: 0   gabapentin (NEURONTIN) 100 MG capsule, TAKE 1 CAPSULE BY MOUTH 3 TIMES DAILY, Disp: 90 capsule, Rfl: 10   glipiZIDE  (GLUCOTROL XL) 10 MG 24 hr tablet, TAKE 1 TABLET BY MOUTH ONCE DAILY WITH BREAKFAST, Disp: 30 tablet, Rfl: 5   lidocaine (LIDODERM) 5 %, Place 1 patch onto the skin daily. Remove & Discard patch within 12 hours or as directed by MD, Disp: 30 patch, Rfl: 0   losartan (COZAAR) 100 MG tablet, TAKE 1 TABLET BY MOUTH ONCE DAILY *DISCONTINUE LISINOPRIL* *EMERGENCY REFILL*, Disp: 30 tablet, Rfl: 0   meloxicam (MOBIC) 7.5 MG tablet, Take 1 tablet (7.5 mg total) by mouth daily. (Patient taking differently: Take 7.5 mg by mouth as needed.), Disp: 30 tablet, Rfl: 0   metFORMIN (GLUCOPHAGE-XR) 500 MG 24 hr tablet, TAKE 2 TABLETS BY MOUTH TWICE DAILY, Disp: 120 tablet, Rfl: 1   TRULICITY 0.75 MG/0.5ML SOPN, Inject 0.75 mg into the skin once a week., Disp: , Rfl:   Social History   Tobacco Use  Smoking Status Never  Smokeless Tobacco Never    No Known Allergies Objective:  There were no vitals filed for this visit. There is no height or weight on file to calculate BMI. Constitutional Well developed. Well nourished.  Vascular Dorsalis pedis pulses palpable bilaterally. Posterior tibial pulses palpable bilaterally. Capillary refill normal to all digits.  No cyanosis or clubbing noted. Pedal hair growth normal.  Neurologic Normal speech. Oriented  to person, place, and time. Epicritic sensation to light touch grossly present bilaterally.  Dermatologic Nails well groomed and normal in appearance. No open wounds. No skin lesions.  Orthopedic: Normal joint ROM without pain or crepitus bilaterally. No visible deformities. Tender to palpation at the calcaneal tuber left. No pain with calcaneal squeeze left. Ankle ROM diminished range of motion left. Silfverskiold Test: Negative left  Tight plantar fascia noted to the left side likely due to recurrence and contracture   Radiographs: Taken and reviewed. No acute fractures or dislocations. No evidence of stress fracture.  Plantar heel spur present.  Posterior heel spur present.   Assessment:   1. Capsulitis of foot    Plan:  Patient was evaluated and treated and all questions answered.   Plantar Fasciitis, left~recurrence - Patient presents with continued follow-up of left plantar fasciitis and it appears that patient has failed all conservative therapy including injection orthotics.  I believe patient will benefit from endoscopic plantar fasciotomy as there is some signs of recurrence noted to the left heel.  I am able to do dorsiflex her toes and able to palpate the tightness of the plantar fascia.  I discussed this with patient and he states understanding like to proceed with surgery.  He does not wishes to undergo any further conservative care  -Cam boot was dispensed -Informed surgical risk consent was reviewed and read aloud to the patient.  I reviewed the films.  I have discussed my findings with the patient in great detail.  I have discussed all risks including but not limited to infection, stiffness, scarring, limp, disability, deformity, damage to blood vessels and nerves, numbness, poor healing, need for braces, arthritis, chronic pain, amputation, death.  All benefits and realistic expectations discussed in great detail.  I have made no promises as to the outcome.  I have provided realistic expectations.  I have offered the patient a 2nd opinion, which they have declined and assured me they preferred to proceed despite the risks  No follow-ups on file.

## 2023-06-29 ENCOUNTER — Telehealth: Payer: Self-pay | Admitting: Urology

## 2023-06-29 NOTE — Telephone Encounter (Signed)
I called pt and the phone just rang then said call couldn't be completed as dialed. I was calling to schedule pt for sx with Dr. Allena Katz.

## 2023-06-30 ENCOUNTER — Telehealth: Payer: Self-pay | Admitting: Urology

## 2023-06-30 NOTE — Telephone Encounter (Signed)
I spoke with pt stating that his BCBS is OON with our office that we would have to cancel his sx with Dr. Allena Katz on 08/14/23. Pt stated that he was aware that we were OON with BCBS. He was told by someone that we could just file his [facility], I informed pt that was not the case we have to file the BCBS, Pt wanted me to clarify that with someone in our office, I went and got shelly and she spoke with the pt. She told the pt that we have to file the [facility] and not just his medicaid. She told pt we would have to cancel his sx. Pt stated that he was going to have his BCBS terminated. Informed pt that when that is terminated, he can call back and we can get him rescheduled for sx with Dr. Allena Katz.

## 2023-07-22 IMAGING — CR DG KNEE COMPLETE 4+V*L*
4 series · 4 of 4 positions shown · non-contrast
Comparison: None.

CLINICAL DATA: Pain left knee

EXAM:
LEFT KNEE - COMPLETE 4+ VIEW

[t knee ap left]
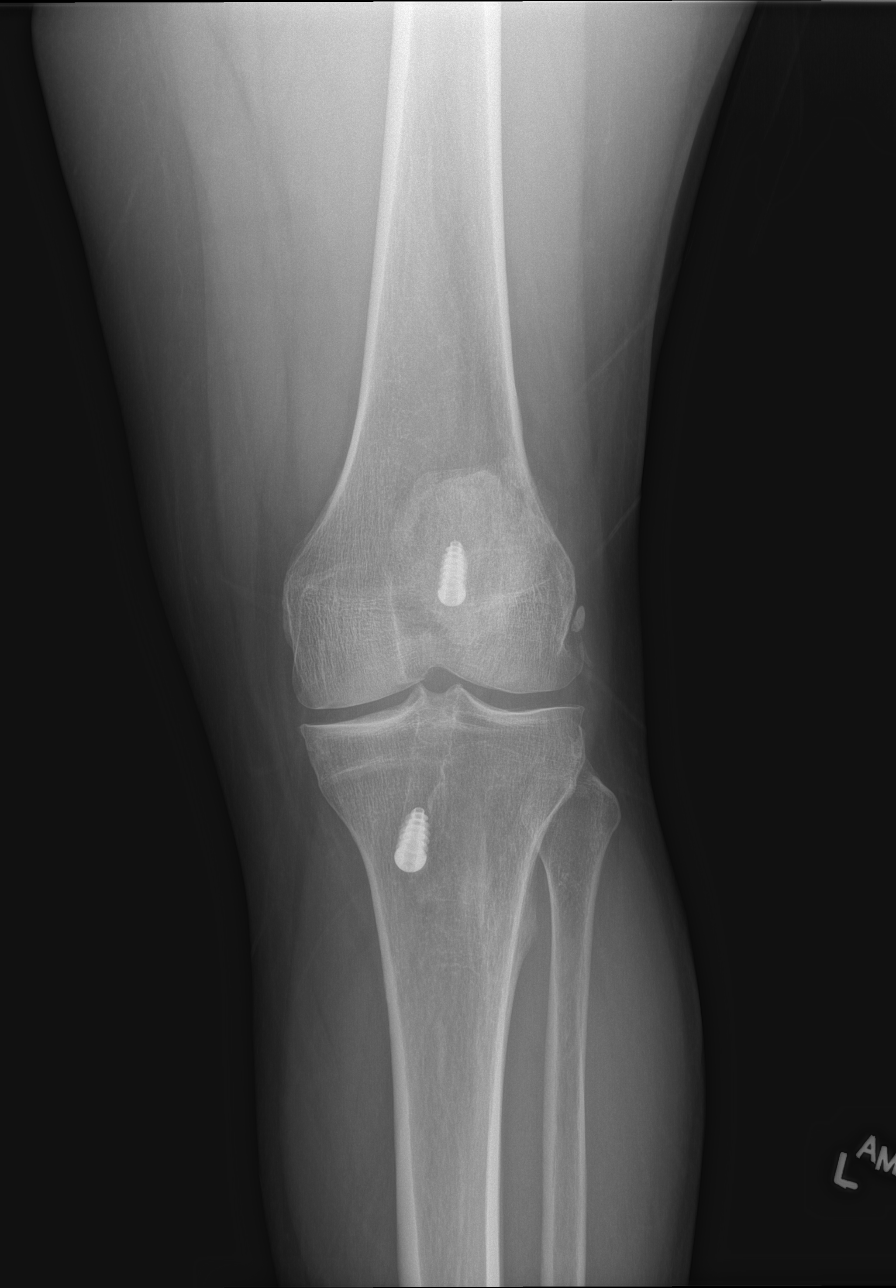

[t knee obl left (1 of 2)]
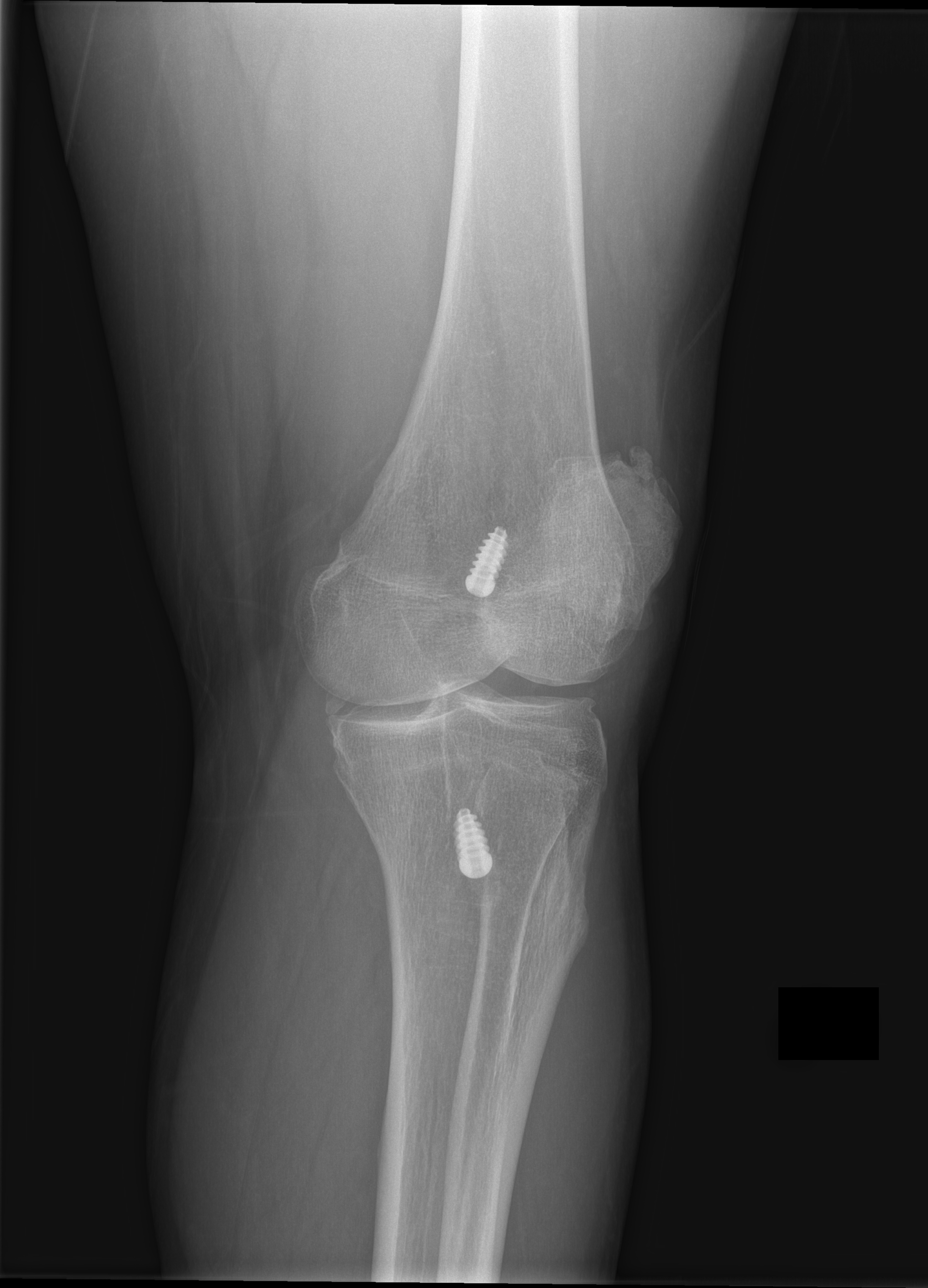

[t knee obl left (2 of 2)]
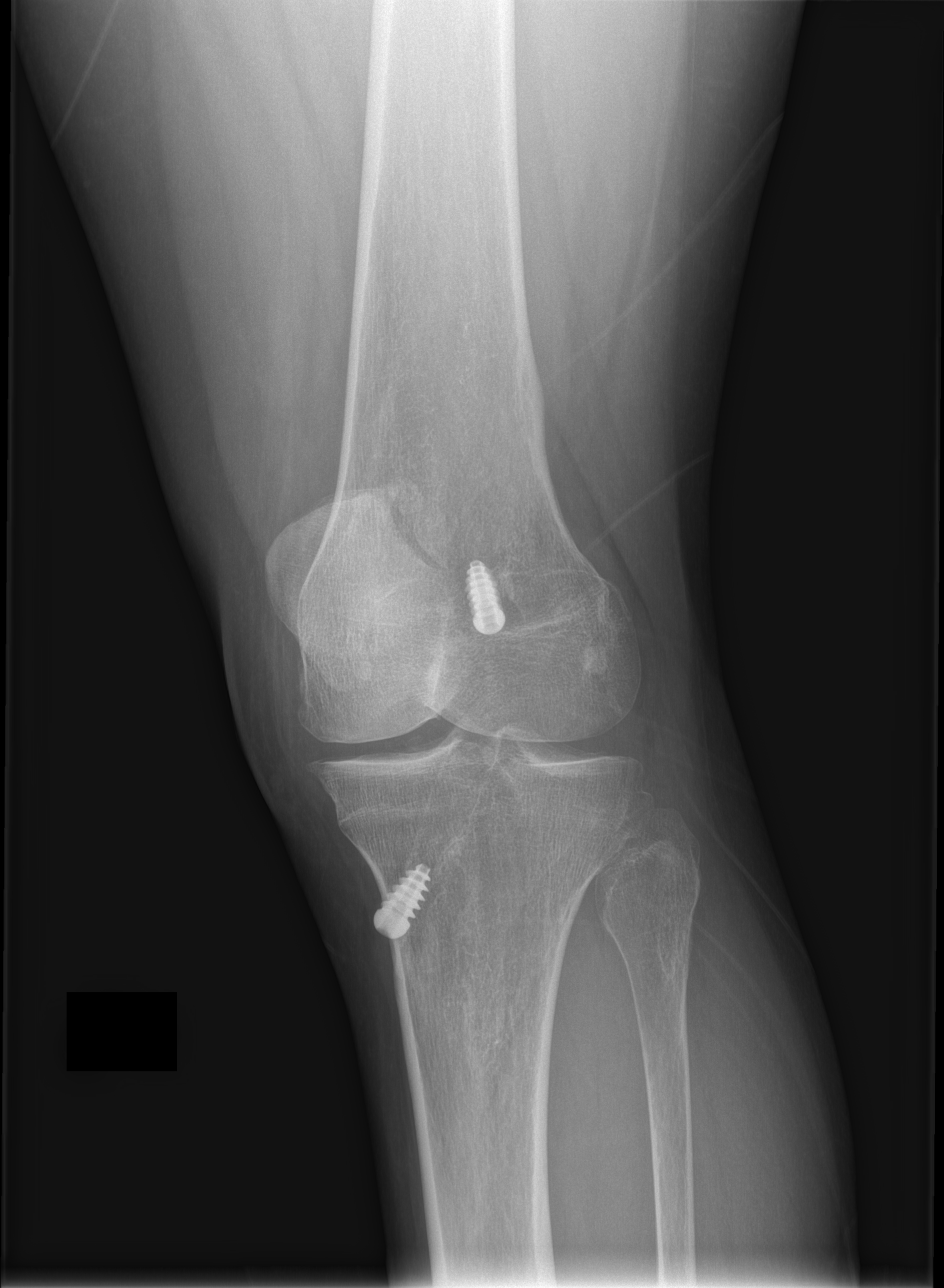

[t knee lat left]
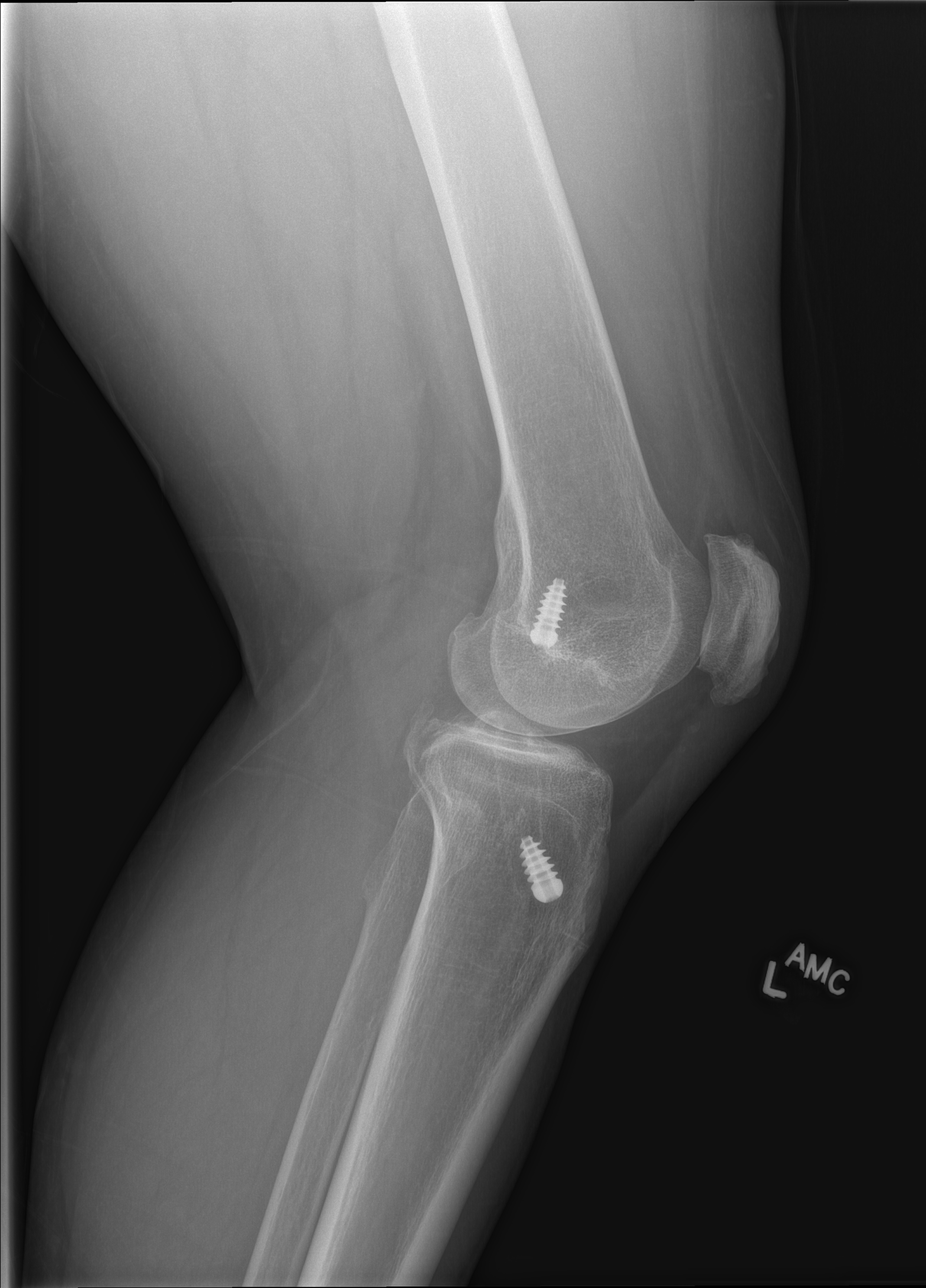

[4 of 4 positions shown; findings below may reference images not displayed]

FINDINGS: No fracture or dislocation is seen. Degenerative changes are noted
with small bony spurs in medial, lateral and patellofemoral
compartments. There is no significant joint effusion. There are
surgical screws in the distal femur and proximal tibia suggesting
previous cruciate ligament repair.
IMPRESSION: No recent fracture or dislocation is seen in the left knee.
Postsurgical changes are noted. Degenerative changes are noted with
bony spurs.

## 2023-08-23 ENCOUNTER — Encounter: Payer: BLUE CROSS/BLUE SHIELD | Admitting: Podiatry

## 2023-09-06 ENCOUNTER — Encounter: Payer: BLUE CROSS/BLUE SHIELD | Admitting: Podiatry

## 2023-09-19 ENCOUNTER — Telehealth: Payer: Self-pay | Admitting: Podiatry

## 2023-09-19 NOTE — Telephone Encounter (Signed)
 DOS:  10/16/2023  ENDOSCOPIC PLANTAR FASCIOTOMY   (LT)    EFFECTIVE DATE:  05/31/2023  DEDUCTIBLE: $0.00  REMAINING: $0.00  OOP:  $2,000.00  REMAINING :  $2,000.00  CO INSURANCE: 25%  PER NATE A OF [facility] NO PRIOR AUTH IS REQUIRED FOR CPT CODE 19147  REF# [id number]

## 2023-10-16 ENCOUNTER — Other Ambulatory Visit: Payer: Self-pay | Admitting: Podiatry

## 2023-10-16 DIAGNOSIS — M722 Plantar fascial fibromatosis: Secondary | ICD-10-CM | POA: Diagnosis not present

## 2023-10-16 MED ORDER — IBUPROFEN 800 MG PO TABS: 800.0000 mg | ORAL_TABLET | Freq: Four times a day (QID) | ORAL | 1 refills | Status: AC | PRN

## 2023-10-16 MED ORDER — OXYCODONE-ACETAMINOPHEN 5-325 MG PO TABS: 1.0000 | ORAL_TABLET | ORAL | 0 refills | Status: DC | PRN

## 2023-10-17 ENCOUNTER — Telehealth: Payer: Self-pay | Admitting: Podiatry

## 2023-10-17 NOTE — Telephone Encounter (Signed)
 Patient is requesting doctor's note for work. Patient contact telephone number, [phone]

## 2023-10-25 ENCOUNTER — Ambulatory Visit (INDEPENDENT_AMBULATORY_CARE_PROVIDER_SITE_OTHER)

## 2023-10-25 ENCOUNTER — Ambulatory Visit (INDEPENDENT_AMBULATORY_CARE_PROVIDER_SITE_OTHER): Admitting: Podiatry

## 2023-10-25 ENCOUNTER — Encounter: Payer: Self-pay | Admitting: Podiatry

## 2023-10-25 DIAGNOSIS — M722 Plantar fascial fibromatosis: Secondary | ICD-10-CM | POA: Diagnosis not present

## 2023-10-25 MED ORDER — OXYCODONE-ACETAMINOPHEN 5-325 MG PO TABS: 1.0000 | ORAL_TABLET | ORAL | 0 refills | Status: AC | PRN

## 2023-10-25 NOTE — Progress Notes (Signed)
   Chief Complaint  Patient presents with   Routine Post Op    POV # 1 DOS 10/16/23 LT EPF(patel pt). Pt states achilles is very sore and had to increase pain medication.    Subjective:  Patient presents today status post endoscopic plantar fasciotomy left.  DOS: 10/16/2023.  Dr. Tinnie Forehand.  Doing well.  He has been experiencing some posterior Achilles pain after surgery  Past Medical History:  Diagnosis Date   Chest pain    Hernia, abdominal    Hypertension 2019   Morbid obesity with BMI of 40.0-44.9, adult ([facility])    Morbid obesity with BMI of 40.0-44.9, adult ([facility])    OSA (obstructive sleep apnea) 2004   OSA on CPAP    Pre-diabetes    Prediabetes 05/30/2017    Past Surgical History:  Procedure Laterality Date   ABDOMINAL HERNIA REPAIR  08/11/2017   umbilical herina repair for incarceration   EYE SURGERY     KNEE SURGERY     left   plantar fiscitis     UMBILICAL HERNIA REPAIR  08/11/2017   Incarcerated hernia   UMBILICAL HERNIA REPAIR N/A 08/11/2017   Procedure: HERNIA REPAIR UMBILICAL ADULT OPEN;  Surgeon: Kinsinger, Alphonso Aschoff, MD;  Location: [facility];  Service: General;  Laterality: N/A;    No Known Allergies  Objective/Physical Exam Neurovascular status intact.  Incision well coapted with sutures intact. No sign of infectious process noted. No dehiscence. No active bleeding noted.  No edema.  The calf is soft and supple.  No calf pain or concern for DVT clinically  Radiographic Exam LT foot 10/25/2023:  Unchanged.  Normal osseous mineralization.  No acute fracture.  Assessment: 1. s/p EPF left. DOS: 10/16/2023.  Dr. Tinnie Forehand   Plan of Care:  -Patient was evaluated. X-rays reviewed - Dressings changed. -Compression ankle sleeve dispensed.  Wear daily -Continue WBAT CAM boot -Refill prescription Percocet 5/325 mg every 4 hours as needed pain #30 -Return to clinic with Dr. Lydia Sams next week suture removal   Dot Gazella, DPM [facility]  Dr.  Dot Gazella, DPM    [address] [place], [place] [address]                Office [phone]  Fax [phone]

## 2023-11-08 ENCOUNTER — Ambulatory Visit (INDEPENDENT_AMBULATORY_CARE_PROVIDER_SITE_OTHER): Admitting: Podiatry

## 2023-11-08 ENCOUNTER — Encounter: Payer: Self-pay | Admitting: Podiatry

## 2023-11-08 DIAGNOSIS — M722 Plantar fascial fibromatosis: Secondary | ICD-10-CM

## 2023-11-08 DIAGNOSIS — Z9889 Other specified postprocedural states: Secondary | ICD-10-CM

## 2023-11-08 MED ORDER — OXYCODONE-ACETAMINOPHEN 5-325 MG PO TABS: 1.0000 | ORAL_TABLET | ORAL | 0 refills | Status: AC | PRN

## 2023-11-08 NOTE — Progress Notes (Signed)
   Chief Complaint  Patient presents with   Routine Post Op    POV # 2 DOS 10/16/23 LT EPF    Subjective:  Patient presents today status post endoscopic plantar fasciotomy left.  DOS: 10/16/2023.  He is patient is doing well.  He is still having some pain.  He is ambulating with a cam boot.  Denies any other acute complaints he is here to get his stitches out  Past Medical History:  Diagnosis Date   Chest pain    Hernia, abdominal    Hypertension 2019   Morbid obesity with BMI of 40.0-44.9, adult ([facility])    Morbid obesity with BMI of 40.0-44.9, adult ([facility])    OSA (obstructive sleep apnea) 2004   OSA on CPAP    Pre-diabetes    Prediabetes 05/30/2017    Past Surgical History:  Procedure Laterality Date   ABDOMINAL HERNIA REPAIR  08/11/2017   umbilical herina repair for incarceration   EYE SURGERY     KNEE SURGERY     left   plantar fiscitis     UMBILICAL HERNIA REPAIR  08/11/2017   Incarcerated hernia   UMBILICAL HERNIA REPAIR N/A 08/11/2017   Procedure: HERNIA REPAIR UMBILICAL ADULT OPEN;  Surgeon: Kinsinger, Alphonso Aschoff, MD;  Location: [facility];  Service: General;  Laterality: N/A;    No Known Allergies  Objective/Physical Exam Neurovascular status intact.  Incision well coapted with sutures intact. No sign of infectious process noted. No dehiscence. No active bleeding noted.  No edema.  The calf is soft and supple.  No calf pain or concern for DVT clinically  Radiographic Exam LT foot 10/25/2023:  Unchanged.  Normal osseous mineralization.  No acute fracture.  Assessment: 1. s/p EPF left. DOS: 10/16/2023.  Dr. Tinnie Forehand   Plan of Care:  -Patient was evaluated. X-rays reviewed - No further dressing.  Patient will return to regular shoes -Compression ankle sleeve dispensed.  Wear daily -Continue WBAT CAM boot - Continue pain medication as needed - Suture was removed no complication noted.  No clinical signs of dehiscence noted.

## 2023-11-09 ENCOUNTER — Telehealth: Payer: Self-pay | Admitting: Podiatry

## 2023-11-09 NOTE — Telephone Encounter (Signed)
 Patient is requesting refill on Oxycodone . Patient contact telephone number, [phone]

## 2023-11-13 ENCOUNTER — Telehealth: Payer: Self-pay | Admitting: Podiatry

## 2023-11-13 NOTE — Telephone Encounter (Signed)
 Patient called and would like his pain medication sent to [facility] in [place] on [address].

## 2023-11-14 MED ORDER — OXYCODONE-ACETAMINOPHEN 5-325 MG PO TABS: 1.0000 | ORAL_TABLET | ORAL | 0 refills | Status: AC | PRN

## 2023-12-06 ENCOUNTER — Encounter: Payer: Self-pay | Admitting: Podiatry

## 2023-12-06 ENCOUNTER — Ambulatory Visit (INDEPENDENT_AMBULATORY_CARE_PROVIDER_SITE_OTHER): Admitting: Podiatry

## 2023-12-06 DIAGNOSIS — M722 Plantar fascial fibromatosis: Secondary | ICD-10-CM

## 2023-12-06 DIAGNOSIS — Z9889 Other specified postprocedural states: Secondary | ICD-10-CM

## 2023-12-06 MED ORDER — OXYCODONE-ACETAMINOPHEN 5-325 MG PO TABS: 1.0000 | ORAL_TABLET | ORAL | 0 refills | Status: AC | PRN

## 2023-12-06 NOTE — Progress Notes (Signed)
   Chief Complaint  Patient presents with   Routine Post Op    POV #3 DOS 10/16/23 LT EPF Pt stated that he is still having soreness and he can not wear a normal shoe     Subjective:  Patient presents today status post endoscopic plantar fasciotomy left.  DOS: 10/16/2023.  He is patient is doing well.  He still having some cramping improving very slowly but still feeling some soreness.  Denies any other acute complaints  Past Medical History:  Diagnosis Date   Chest pain    Hernia, abdominal    Hypertension 2019   Morbid obesity with BMI of 40.0-44.9, adult ([facility])    Morbid obesity with BMI of 40.0-44.9, adult ([facility])    OSA (obstructive sleep apnea) 2004   OSA on CPAP    Pre-diabetes    Prediabetes 05/30/2017    Past Surgical History:  Procedure Laterality Date   ABDOMINAL HERNIA REPAIR  08/11/2017   umbilical herina repair for incarceration   EYE SURGERY     KNEE SURGERY     left   plantar fiscitis     UMBILICAL HERNIA REPAIR  08/11/2017   Incarcerated hernia   UMBILICAL HERNIA REPAIR N/A 08/11/2017   Procedure: HERNIA REPAIR UMBILICAL ADULT OPEN;  Surgeon: Kinsinger, Herlene Righter, MD;  Location: [facility];  Service: General;  Laterality: N/A;    No Known Allergies  Objective/Physical Exam Neurovascular status intact.  Incision well coapted with sutures intact. No sign of infectious process noted. No dehiscence. No active bleeding noted.  No edema.  The calf is soft and supple.  No calf pain or concern for DVT clinically  Radiographic Exam LT foot 10/25/2023:  Unchanged.  Normal osseous mineralization.  No acute fracture.  Assessment: 1. s/p EPF left. DOS: 10/16/2023.  Dr. Franky Blanch   Plan of Care:  -Patient was evaluated. X-rays reviewed - No further dressing.  Patient will return to regular shoes - Clinically some soreness noted however improving.  Encouraged him to continue returning to regular shoes.  He states understanding. - Pain medication was refilled

## 2023-12-15 ENCOUNTER — Encounter: Payer: Self-pay | Admitting: Advanced Practice Midwife

## 2024-01-03 ENCOUNTER — Ambulatory Visit: Admitting: Podiatry

## 2024-01-19 ENCOUNTER — Ambulatory Visit (INDEPENDENT_AMBULATORY_CARE_PROVIDER_SITE_OTHER): Admitting: Podiatry

## 2024-01-19 ENCOUNTER — Encounter: Payer: Self-pay | Admitting: Podiatry

## 2024-01-19 DIAGNOSIS — M722 Plantar fascial fibromatosis: Secondary | ICD-10-CM

## 2024-01-19 DIAGNOSIS — M792 Neuralgia and neuritis, unspecified: Secondary | ICD-10-CM | POA: Diagnosis not present

## 2024-01-19 MED ORDER — OXYCODONE-ACETAMINOPHEN 5-325 MG PO TABS: 1.0000 | ORAL_TABLET | ORAL | 0 refills | Status: AC | PRN

## 2024-01-19 NOTE — Progress Notes (Signed)
   Chief Complaint  Patient presents with   Plantar Fasciitis    Pt stated that he still has some discomfort when he is on his foot     Subjective:  Patient presents today status post endoscopic plantar fasciotomy left.  DOS: 10/16/2023.  He is patient is doing well.  He still having some cramping improving very slowly but still feeling some soreness.  Denies any other acute complaints  Past Medical History:  Diagnosis Date   Chest pain    Hernia, abdominal    Hypertension 2019   Morbid obesity with BMI of 40.0-44.9, adult (HCC)    Morbid obesity with BMI of 40.0-44.9, adult (HCC)    OSA (obstructive sleep apnea) 2004   OSA on CPAP    Pre-diabetes    Prediabetes 05/30/2017    Past Surgical History:  Procedure Laterality Date   ABDOMINAL HERNIA REPAIR  08/11/2017   umbilical herina repair for incarceration   EYE SURGERY     KNEE SURGERY     left   plantar fiscitis     UMBILICAL HERNIA REPAIR  08/11/2017   Incarcerated hernia   UMBILICAL HERNIA REPAIR N/A 08/11/2017   Procedure: HERNIA REPAIR UMBILICAL ADULT OPEN;  Surgeon: Kinsinger, Herlene Righter, MD;  Location: [facility] OR;  Service: General;  Laterality: N/A;    No Known Allergies  Objective/Physical Exam Neurovascular status intact.  Incision well coapted with sutures intact. No sign of infectious process noted. No dehiscence. No active bleeding noted.  No edema.  The calf is soft and supple.  No calf pain or concern for DVT clinically  Radiographic Exam LT foot 10/25/2023:  Unchanged.  Normal osseous mineralization.  No acute fracture.  Assessment: 1. s/p EPF left. DOS: 10/16/2023.  Dr. Franky Blanch   Plan of Care:  -Patient was evaluated. X-rays reviewed - No further dressing.  Patient has returned to regular shoes feels better. - Pain medication was refilled and only be taken as needed - Patient experiencing neuritis like symptoms as expected for postoperative surgery that he is experience.  Continue light duty at work.   He states understanding

## 2024-03-01 ENCOUNTER — Ambulatory Visit (INDEPENDENT_AMBULATORY_CARE_PROVIDER_SITE_OTHER): Admitting: Podiatry

## 2024-03-01 DIAGNOSIS — Z9889 Other specified postprocedural states: Secondary | ICD-10-CM

## 2024-03-01 DIAGNOSIS — M722 Plantar fascial fibromatosis: Secondary | ICD-10-CM

## 2024-03-01 MED ORDER — OXYCODONE-ACETAMINOPHEN 5-325 MG PO TABS: 1.0000 | ORAL_TABLET | ORAL | 0 refills | Status: AC | PRN

## 2024-03-01 NOTE — Progress Notes (Signed)
   Chief Complaint  Patient presents with   Plantar Fasciitis    Pt stated that he is getting there he stated that he still has a little soreness but no new concerns     Subjective:  Patient presents today status post endoscopic plantar fasciotomy left.  DOS: 10/16/2023.  He is patient is doing well.  He states his foot is feeling much better now.  Denies any other acute complaints.  Past Medical History:  Diagnosis Date   Chest pain    Hernia, abdominal    Hypertension 2019   Morbid obesity with BMI of 40.0-44.9, adult (HCC)    Morbid obesity with BMI of 40.0-44.9, adult (HCC)    OSA (obstructive sleep apnea) 2004   OSA on CPAP    Pre-diabetes    Prediabetes 05/30/2017    Past Surgical History:  Procedure Laterality Date   ABDOMINAL HERNIA REPAIR  08/11/2017   umbilical herina repair for incarceration   EYE SURGERY     KNEE SURGERY     left   plantar fiscitis     UMBILICAL HERNIA REPAIR  08/11/2017   Incarcerated hernia   UMBILICAL HERNIA REPAIR N/A 08/11/2017   Procedure: HERNIA REPAIR UMBILICAL ADULT OPEN;  Surgeon: Kinsinger, Herlene Righter, MD;  Location: [facility] OR;  Service: General;  Laterality: N/A;    No Known Allergies  Objective/Physical Exam Neurovascular status intact.  Incision well coapted with sutures intact. No sign of infectious process noted. No dehiscence. No active bleeding noted.  No edema.  The calf is soft and supple.  No calf pain or concern for DVT clinically  Radiographic Exam LT foot 10/25/2023:  Unchanged.  Normal osseous mineralization.  No acute fracture.  Assessment: 1. s/p EPF left. DOS: 10/16/2023.  Dr. Franky Blanch   Plan of Care:  -Patient was evaluated. X-rays reviewed - Clinically he is doing much better.  He states little bit of soreness but overall much improved.  He has returned to regular activities no restrictions - Patient experiencing neuritis like symptoms as expected for postoperative surgery that he is experience.  Continue light  duty at work.  He states understanding - He is officially discharged from my care if any foot and ankle issues are future he will come back and see me.

## 2024-04-12 ENCOUNTER — Ambulatory Visit: Admitting: Podiatry
